# Patient Record
Sex: Female | Born: 1950 | Race: White | Hispanic: No | Smoking: Former smoker
Health system: Southern US, Community
[De-identification: ages and names within clinical notes are randomized; demographics above are authoritative.]

## PROBLEM LIST (undated history)

## (undated) DIAGNOSIS — F329 Major depressive disorder, single episode, unspecified: Secondary | ICD-10-CM

## (undated) DIAGNOSIS — G2581 Restless legs syndrome: Secondary | ICD-10-CM

## (undated) DIAGNOSIS — F32A Depression, unspecified: Secondary | ICD-10-CM

## (undated) HISTORY — PX: TUBAL LIGATION: SHX77

---

## 2019-02-15 ENCOUNTER — Inpatient Hospital Stay (HOSPITAL_COMMUNITY)
Admission: AD | Admit: 2019-02-15 | Discharge: 2019-03-01 | DRG: 207 | Disposition: A | Discharge status: Home Health | Payer: Medicare Other | Source: Other Acute Inpatient Hospital | Attending: Internal Medicine | Admitting: Internal Medicine

## 2019-02-15 ENCOUNTER — Inpatient Hospital Stay (HOSPITAL_COMMUNITY): Payer: Medicare Other

## 2019-02-15 DIAGNOSIS — J9690 Respiratory failure, unspecified, unspecified whether with hypoxia or hypercapnia: Secondary | ICD-10-CM

## 2019-02-15 DIAGNOSIS — J384 Edema of larynx: Secondary | ICD-10-CM | POA: Diagnosis present

## 2019-02-15 DIAGNOSIS — Z9911 Dependence on respirator [ventilator] status: Secondary | ICD-10-CM | POA: Diagnosis not present

## 2019-02-15 DIAGNOSIS — Z6841 Body Mass Index (BMI) 40.0 and over, adult: Secondary | ICD-10-CM

## 2019-02-15 DIAGNOSIS — J1289 Other viral pneumonia: Secondary | ICD-10-CM | POA: Diagnosis present

## 2019-02-15 DIAGNOSIS — L8990 Pressure ulcer of unspecified site, unspecified stage: Secondary | ICD-10-CM

## 2019-02-15 DIAGNOSIS — Z79899 Other long term (current) drug therapy: Secondary | ICD-10-CM

## 2019-02-15 DIAGNOSIS — F329 Major depressive disorder, single episode, unspecified: Secondary | ICD-10-CM | POA: Diagnosis present

## 2019-02-15 DIAGNOSIS — R6889 Other general symptoms and signs: Secondary | ICD-10-CM | POA: Diagnosis not present

## 2019-02-15 DIAGNOSIS — Z20822 Contact with and (suspected) exposure to covid-19: Secondary | ICD-10-CM

## 2019-02-15 DIAGNOSIS — F17200 Nicotine dependence, unspecified, uncomplicated: Secondary | ICD-10-CM | POA: Diagnosis present

## 2019-02-15 DIAGNOSIS — Z66 Do not resuscitate: Secondary | ICD-10-CM | POA: Diagnosis present

## 2019-02-15 DIAGNOSIS — J9601 Acute respiratory failure with hypoxia: Secondary | ICD-10-CM | POA: Diagnosis present

## 2019-02-15 DIAGNOSIS — F419 Anxiety disorder, unspecified: Secondary | ICD-10-CM | POA: Diagnosis present

## 2019-02-15 DIAGNOSIS — Z01818 Encounter for other preprocedural examination: Secondary | ICD-10-CM

## 2019-02-15 DIAGNOSIS — L89812 Pressure ulcer of head, stage 2: Secondary | ICD-10-CM | POA: Diagnosis not present

## 2019-02-15 DIAGNOSIS — J80 Acute respiratory distress syndrome: Secondary | ICD-10-CM | POA: Diagnosis not present

## 2019-02-15 DIAGNOSIS — E662 Morbid (severe) obesity with alveolar hypoventilation: Secondary | ICD-10-CM | POA: Diagnosis present

## 2019-02-15 DIAGNOSIS — R001 Bradycardia, unspecified: Secondary | ICD-10-CM | POA: Diagnosis present

## 2019-02-15 DIAGNOSIS — Z978 Presence of other specified devices: Secondary | ICD-10-CM | POA: Diagnosis not present

## 2019-02-15 DIAGNOSIS — E785 Hyperlipidemia, unspecified: Secondary | ICD-10-CM | POA: Diagnosis present

## 2019-02-15 DIAGNOSIS — G9340 Encephalopathy, unspecified: Secondary | ICD-10-CM | POA: Diagnosis present

## 2019-02-15 DIAGNOSIS — R7989 Other specified abnormal findings of blood chemistry: Secondary | ICD-10-CM | POA: Diagnosis not present

## 2019-02-15 DIAGNOSIS — R579 Shock, unspecified: Secondary | ICD-10-CM | POA: Diagnosis not present

## 2019-02-15 DIAGNOSIS — R9431 Abnormal electrocardiogram [ECG] [EKG]: Secondary | ICD-10-CM | POA: Diagnosis present

## 2019-02-15 DIAGNOSIS — I959 Hypotension, unspecified: Secondary | ICD-10-CM | POA: Diagnosis not present

## 2019-02-15 DIAGNOSIS — R509 Fever, unspecified: Secondary | ICD-10-CM | POA: Diagnosis not present

## 2019-02-15 DIAGNOSIS — E876 Hypokalemia: Secondary | ICD-10-CM | POA: Diagnosis not present

## 2019-02-15 DIAGNOSIS — R0602 Shortness of breath: Secondary | ICD-10-CM

## 2019-02-15 DIAGNOSIS — Z452 Encounter for adjustment and management of vascular access device: Secondary | ICD-10-CM

## 2019-02-15 DIAGNOSIS — Z9289 Personal history of other medical treatment: Secondary | ICD-10-CM

## 2019-02-15 DIAGNOSIS — R0600 Dyspnea, unspecified: Secondary | ICD-10-CM

## 2019-02-15 HISTORY — DX: Depression, unspecified: F32.A

## 2019-02-15 HISTORY — DX: Major depressive disorder, single episode, unspecified: F32.9

## 2019-02-15 HISTORY — DX: Restless legs syndrome: G25.81

## 2019-02-15 LAB — POCT I-STAT 7, (LYTES, BLD GAS, ICA,H+H)
Acid-Base Excess: 7 mmol/L — ABNORMAL HIGH (ref 0.0–2.0)
Bicarbonate: 33.1 mmol/L — ABNORMAL HIGH (ref 20.0–28.0)
Calcium, Ion: 0.98 mmol/L — ABNORMAL LOW (ref 1.15–1.40)
HCT: 41 % (ref 36.0–46.0)
Hemoglobin: 13.9 g/dL (ref 12.0–15.0)
O2 Saturation: 94 %
Patient temperature: 98.6
Potassium: 2.9 mmol/L — ABNORMAL LOW (ref 3.5–5.1)
Sodium: 141 mmol/L (ref 135–145)
TCO2: 35 mmol/L — ABNORMAL HIGH (ref 22–32)
pCO2 arterial: 52.3 mmHg — ABNORMAL HIGH (ref 32.0–48.0)
pH, Arterial: 7.409 (ref 7.350–7.450)
pO2, Arterial: 73 mmHg — ABNORMAL LOW (ref 83.0–108.0)

## 2019-02-15 LAB — MRSA PCR SCREENING: MRSA by PCR: NEGATIVE

## 2019-02-15 MED ORDER — ZINC SULFATE 220 (50 ZN) MG PO CAPS
220.0000 mg | ORAL_CAPSULE | Freq: Two times a day (BID) | ORAL | Status: DC
  Administered 2019-02-16 (×2): 220 mg
  Filled 2019-02-15 (×2): qty 1

## 2019-02-15 MED ORDER — HEPARIN (PORCINE) 25000 UT/250ML-% IV SOLN
1600.0000 [IU]/h | INTRAVENOUS | Status: DC
  Administered 2019-02-15: 1450 [IU]/h via INTRAVENOUS
  Administered 2019-02-16: 1600 [IU]/h via INTRAVENOUS
  Filled 2019-02-15 (×4): qty 250

## 2019-02-15 MED ORDER — MIDAZOLAM HCL 2 MG/2ML IJ SOLN
2.0000 mg | Freq: Once | INTRAMUSCULAR | Status: AC
  Administered 2019-02-15: 21:00:00 2 mg via INTRAVENOUS

## 2019-02-15 MED ORDER — HEPARIN BOLUS VIA INFUSION
5000.0000 [IU] | Freq: Once | INTRAVENOUS | Status: AC
  Administered 2019-02-15: 5000 [IU] via INTRAVENOUS
  Filled 2019-02-15: qty 5000

## 2019-02-15 MED ORDER — ETOMIDATE 2 MG/ML IV SOLN
20.0000 mg | Freq: Once | INTRAVENOUS | Status: AC
  Administered 2019-02-15: 21:00:00 20 mg via INTRAVENOUS

## 2019-02-15 MED ORDER — FENTANYL BOLUS VIA INFUSION
25.0000 ug | INTRAVENOUS | Status: DC | PRN
  Administered 2019-02-15: 21:00:00 50 ug via INTRAVENOUS
  Administered 2019-02-16 (×2): 25 ug via INTRAVENOUS
  Filled 2019-02-15: qty 25

## 2019-02-15 MED ORDER — FENTANYL CITRATE (PF) 100 MCG/2ML IJ SOLN: 50.0000 ug | Freq: Once | INTRAMUSCULAR | Status: AC

## 2019-02-15 MED ORDER — FENTANYL 2500MCG IN NS 250ML (10MCG/ML) PREMIX INFUSION
25.0000 ug/h | INTRAVENOUS | Status: AC
  Administered 2019-02-15 – 2019-02-16 (×2): 200 ug/h via INTRAVENOUS
  Administered 2019-02-16 – 2019-02-17 (×5): 400 ug/h via INTRAVENOUS
  Filled 2019-02-15 (×6): qty 250

## 2019-02-15 MED ORDER — MIDAZOLAM HCL 2 MG/2ML IJ SOLN
1.0000 mg | INTRAMUSCULAR | Status: DC | PRN
  Administered 2019-02-15 – 2019-02-17 (×3): 1 mg via INTRAVENOUS

## 2019-02-15 MED ORDER — SODIUM CHLORIDE 0.9 % IV SOLN
2.0000 g | Freq: Every day | INTRAVENOUS | Status: DC
  Administered 2019-02-15 – 2019-02-19 (×5): 2 g via INTRAVENOUS
  Filled 2019-02-15 (×6): qty 20

## 2019-02-15 MED ORDER — ROCURONIUM BROMIDE 50 MG/5ML IV SOLN
100.0000 mg | Freq: Once | INTRAVENOUS | Status: AC
  Administered 2019-02-15: 21:00:00 100 mg via INTRAVENOUS
  Filled 2019-02-15: qty 10

## 2019-02-15 MED ORDER — SODIUM CHLORIDE 0.9 % IV SOLN
500.0000 mg | Freq: Every day | INTRAVENOUS | Status: DC
  Administered 2019-02-15 – 2019-02-19 (×5): 500 mg via INTRAVENOUS
  Filled 2019-02-15 (×6): qty 500

## 2019-02-15 MED ORDER — TOCILIZUMAB 400 MG/20ML IV SOLN
400.0000 mg | Freq: Once | INTRAVENOUS | Status: DC
  Filled 2019-02-15: qty 20

## 2019-02-15 MED ORDER — HYDROXYCHLOROQUINE SULFATE 200 MG PO TABS
200.0000 mg | ORAL_TABLET | Freq: Two times a day (BID) | ORAL | Status: AC
  Administered 2019-02-16 – 2019-02-20 (×8): 200 mg
  Filled 2019-02-15 (×8): qty 1

## 2019-02-15 MED ORDER — MORPHINE SULFATE 15 MG PO TABS
30.0000 mg | ORAL_TABLET | ORAL | Status: DC | PRN
  Administered 2019-02-18 – 2019-02-20 (×2): 30 mg
  Filled 2019-02-15 (×2): qty 2

## 2019-02-15 MED ORDER — MIDAZOLAM HCL 2 MG/2ML IJ SOLN
INTRAMUSCULAR | Status: AC
  Administered 2019-02-15: 21:00:00 2 mg via INTRAVENOUS
  Filled 2019-02-15: qty 4

## 2019-02-15 MED ORDER — MIDAZOLAM HCL 2 MG/2ML IJ SOLN
1.0000 mg | INTRAMUSCULAR | Status: DC | PRN
  Administered 2019-02-17 (×2): 1 mg via INTRAVENOUS
  Filled 2019-02-15: qty 2

## 2019-02-15 MED ORDER — SODIUM CHLORIDE 0.9 % IV SOLN
INTRAVENOUS | Status: DC
  Administered 2019-02-15 – 2019-02-16 (×2): via INTRAVENOUS

## 2019-02-15 MED ORDER — PANTOPRAZOLE SODIUM 40 MG IV SOLR
40.0000 mg | Freq: Every day | INTRAVENOUS | Status: DC
  Administered 2019-02-15 – 2019-02-16 (×2): 40 mg via INTRAVENOUS
  Filled 2019-02-15 (×2): qty 40

## 2019-02-15 MED ORDER — CEFTRIAXONE SODIUM 1 G IJ SOLR: 1.0000 g | INTRAMUSCULAR | Status: DC

## 2019-02-15 MED ORDER — HYDROXYCHLOROQUINE SULFATE 200 MG PO TABS
400.0000 mg | ORAL_TABLET | Freq: Two times a day (BID) | ORAL | Status: AC
  Administered 2019-02-16: 10:00:00 400 mg
  Filled 2019-02-15 (×2): qty 2

## 2019-02-15 NOTE — Procedures (Signed)
Intubation Procedure Note Shine Scrogham [id number] 06/08/1951  Procedure: Intubation Indications: Respiratory insufficiency  Procedure Details Consent: Risks of procedure as well as the alternatives and risks of each were explained to the (patient/caregiver).  Consent for procedure obtained. Time Out: Verified patient identification, verified procedure, site/side was marked, verified correct patient position, special equipment/implants available, medications/allergies/relevent history reviewed, required imaging and test results available.  Performed  Maximum sterile technique was used including antiseptics, cap, gloves, gown, hand hygiene and mask.  MAC and 3   RSI: Etomidate 20 and Fentanyl gtt started at 50 Pt was somnolent but did not achieve complete sedation Additional versed 2 was given Post introduction of ETT past VC, 100 Rocuronium given RN given prn orders for Versed and instructed to titrate Fentanyl up to 150 to achieve synchrony with vent  Evaluation Hemodynamic Status: BP stable throughout; O2 sats: stable throughout Patient's Current Condition: stable Complications: No apparent complications Patient did tolerate procedure well. Chest X-ray ordered to verify placement.  CXR: pending.   Rise Paganini Scatliffe 02/15/2019

## 2019-02-15 NOTE — H&P (Addendum)
NAME:  Sherry KeasCatherine Dovidio, MRN:  [id number], DOB:  11/09/1950, LOS: 0 ADMISSION DATE:  02/15/2019, CONSULTATION DATE:  02/15/19 REFERRING MD:  [facility]  CHIEF COMPLAINT:  SOB   Brief History   Sherry Becker is a 68 y.o. female who was admitted 4/8 after transfer from [place] [facility] with acute hypoxic respiratory failure requiring intubation.  She is currently admitted as suspected COVID and is being ruled out.  History of present illness   Pt is encephelopathic; therefore, this HPI is obtained from chart review. Sherry KeasCatherine Basich is a 68 y.o. female who has a PMH as outlined below (see "past medical history").  She [facility] 4[facility]8 with SOB.  Stated that symptoms had been present for 1 month and had minimal improvement.  She had seen PCP and "had a blood test done" (no results known or available).  No known travel history.  No known additional symptoms including fever, cough.  [facility], she had SpO2 of 34% on room air.  She was placed on 15L NRB and was [facility] and management and as a COVID-19 rule out.  [facility], she was intubated for persistent hypoxic respiratory failure requiring 15L NRB with increased WOB.  Labs [facility] noteable for D-dimer of 2158, CRP 185, lactic acid 3.9, PCT 0.18, WBC 7.4  EKG with NSR and QTc 404.  CXR demonstrated extensive bilateral pulmonary infiltrates.  Past Medical History  No definitive history but per med list from [facility], suspect combo of depression and mood disorder along with HLD.  Probable OSA.  Significant Hospital Events   4/8 > admit, intubated.  Consults:  None.  Procedures:  ETT 4/8 >   Significant Diagnostic Tests:  CXR 4/8 > extensive bilateral pulmonary infiltrates. LE duplex 4/8 >   Micro Data:  Blood 4/8 >  Sputum 4/8 >  RVP 4/8 >  COVID-19 4/18 >   Antimicrobials:  Azithromycin 4/8 >  Ceftriaxone 4/8 >    Interim history/subjective:  Just  intubated.  Objective:  There were no vitals taken for this visit.       No intake or output data in the 24 hours ending 02/15/19 2054 There were no vitals filed for this visit.  Examination: General: Adult female, just intubated. Neuro: Sedated, not responsive. HEENT: [place]/AT. Tongue petechia noted. Sclerae anicteric.  ETT in place. Cardiovascular: RRR, no M/R/G.  Lungs: Respirations even and unlabored.  Coarse bilaterally. Abdomen: BS x 4, soft, NT/ND.  Musculoskeletal: No gross deformities, no edema.  Skin: Intact, warm, no rashes.  Assessment & Plan:   Acute hypoxic respiratory failure - unclear etiology, possibly PNA vs COVID-19 vs PE (though bilateral infiltrates on CXR argues against this). - Just intubated, start on PRVC at 7cc/kg and adjust accordingly. - Assess ABG in 1 hour. - Bronchial hygiene. - Empiric ceftriaxone and azithromycin. - Empiric hydroxychloroquine, zinc. - Consider tocilizumab (IL-6 sent). - Follow cultures, RVP, nCoV. - Follow CXR.  Concern for PE - 1 month symptoms of dyspnea along with extremely elevated d-dimer (2158).  Could also be attributed to CoV alone; though D-dimer this high warrants empiric treatment.  Unable to CTA chest due to CoV rule out. - Empiric heparin gtt. - Assess LE duplex.  Best Practice:  Diet: TF's. Pain/Anxiety/Delirium protocol (if indicated): Fentanyl gtt / Midazolam PRN / Morphine scheduled.  RASS goal -1. VAP protocol (if indicated): In place. DVT prophylaxis: Heparin gtt. GI prophylaxis: PPI. Glucose control: N/A. Mobility:  Bedrest. Code Status: Full. Family Communication: Will call daughter and update over phone. Disposition: ICU.  Labs   CBC: No results for input(s): WBC, NEUTROABS, HGB, HCT, MCV, PLT in the last 168 hours. Basic Metabolic Panel: No results for input(s): NA, K, CL, CO2, GLUCOSE, BUN, CREATININE, CALCIUM, MG, PHOS in the last 168 hours. GFR: CrCl cannot be calculated (No successful lab  value found.). No results for input(s): PROCALCITON, WBC, LATICACIDVEN in the last 168 hours. Liver Function Tests: No results for input(s): AST, ALT, ALKPHOS, BILITOT, PROT, ALBUMIN in the last 168 hours. No results for input(s): LIPASE, AMYLASE in the last 168 hours. No results for input(s): AMMONIA in the last 168 hours. ABG No results found for: PHART, PCO2ART, PO2ART, HCO3, TCO2, ACIDBASEDEF, O2SAT  Coagulation Profile: No results for input(s): INR, PROTIME in the last 168 hours. Cardiac Enzymes: No results for input(s): CKTOTAL, CKMB, CKMBINDEX, TROPONINI in the last 168 hours. HbA1C: No results found for: HGBA1C CBG: No results for input(s): GLUCAP in the last 168 hours.  Review of Systems:   Unable to obtain as pt is encephalopathic.  Past medical history  She,  has no past medical history on file.   Surgical History   Not available.  Social History      Family history   Her family history is not on file.   Allergies No Known Allergies   Home meds  Prior to Admission medications   Not on File    Critical care time: 45 min.    Rutherford Guys, PA [facility] Pager: [phone].  If no answer, [phone] 02/15/2019, 8:54 PM

## 2019-02-15 NOTE — Progress Notes (Signed)
eLink Physician-Brief Progress Note Patient Name: Sherry Becker DOB: [birth year] MRN: [id number]   Date of Service  02/15/2019  HPI/Events of Note  68 y/o F presented at [place] with worsening shortness of breath. Transferred on 100% NRB mask but had to be intubated on arrival. AC 24/480/100% 16 PEEP. Sedated on Fentanyl and synchronized on vent. BP 128/81  HR 60 not requiring pressors  eICU Interventions   Started on HQ, azithromycin. Possible tocilizumab if without contraindication  Lung protective vent strategy        Sherry Becker 02/15/2019, 10:29 PM

## 2019-02-15 NOTE — Progress Notes (Signed)
ANTICOAGULATION CONSULT NOTE - Initial Consult  Pharmacy Consult for heparin Indication: R/o PE  No Known Allergies  Patient Measurements: Height: 5\' 4"  (162.6 cm) Weight: 290 lb (131.5 kg) IBW/kg (Calculated) : 54.7 Heparin Dosing Weight: 87 kg  Vital Signs: BP: 149/87 (04/08 2115) Pulse Rate: 75 (04/08 2115)  Labs: No results for input(s): HGB, HCT, PLT, APTT, LABPROT, INR, HEPARINUNFRC, HEPRLOWMOCWT, CREATININE, CKTOTAL, CKMB, TROPONINI in the last 72 hours.  CrCl cannot be calculated (No successful lab value found.).   Medical History: No past medical history on file.  Medications:  No medications prior to admission.    Assessment: 54 YOF with acute respiratory failure and elevated D-dimer to start IV heparin due to concern for acute PE. Patient transferred from OSH but no anticoagulants administered prior to transfer.   Goal of Therapy:  Heparin level 0.3-0.7 units/ml Monitor platelets by anticoagulation protocol: Yes   Plan:  -Heparin 5000 units IV bolus, then start IV heparin at 1450 units/hr -F/u 6 hr HL -Monitor daily HL, CBC and s/s of bleeding   Vinnie Level, PharmD., BCPS Clinical Pharmacist Clinical phone for 02/15/19 until 10:30pm: [phone] If after 10:30pm, please refer to [facility] for unit-specific pharmacist

## 2019-02-16 ENCOUNTER — Inpatient Hospital Stay (HOSPITAL_COMMUNITY): Payer: Medicare Other

## 2019-02-16 LAB — CBC WITH DIFFERENTIAL/PLATELET
Abs Immature Granulocytes: 0.06 10*3/uL (ref 0.00–0.07)
Basophils Absolute: 0 10*3/uL (ref 0.0–0.1)
Basophils Relative: 0 %
Eosinophils Absolute: 0 10*3/uL (ref 0.0–0.5)
Eosinophils Relative: 0 %
HCT: 42.7 % (ref 36.0–46.0)
Hemoglobin: 13 g/dL (ref 12.0–15.0)
Immature Granulocytes: 1 %
Lymphocytes Relative: 23 %
Lymphs Abs: 1.5 10*3/uL (ref 0.7–4.0)
MCH: 29.5 pg (ref 26.0–34.0)
MCHC: 30.4 g/dL (ref 30.0–36.0)
MCV: 96.8 fL (ref 80.0–100.0)
Monocytes Absolute: 0.4 10*3/uL (ref 0.1–1.0)
Monocytes Relative: 6 %
Neutro Abs: 4.5 10*3/uL (ref 1.7–7.7)
Neutrophils Relative %: 70 %
Platelets: 161 10*3/uL (ref 150–400)
RBC: 4.41 MIL/uL (ref 3.87–5.11)
RDW: 14.6 % (ref 11.5–15.5)
WBC: 6.4 10*3/uL (ref 4.0–10.5)
nRBC: 0.6 % — ABNORMAL HIGH (ref 0.0–0.2)

## 2019-02-16 LAB — POCT I-STAT 7, (LYTES, BLD GAS, ICA,H+H)
Acid-Base Excess: 8 mmol/L — ABNORMAL HIGH (ref 0.0–2.0)
Acid-Base Excess: 6 mmol/L — ABNORMAL HIGH (ref 0.0–2.0)
Acid-Base Excess: 10 mmol/L — ABNORMAL HIGH (ref 0.0–2.0)
Bicarbonate: 33.1 mmol/L — ABNORMAL HIGH (ref 20.0–28.0)
Bicarbonate: 32.2 mmol/L — ABNORMAL HIGH (ref 20.0–28.0)
Bicarbonate: 34.7 mmol/L — ABNORMAL HIGH (ref 20.0–28.0)
Calcium, Ion: 0.97 mmol/L — ABNORMAL LOW (ref 1.15–1.40)
Calcium, Ion: 1.01 mmol/L — ABNORMAL LOW (ref 1.15–1.40)
Calcium, Ion: 0.93 mmol/L — ABNORMAL LOW (ref 1.15–1.40)
HCT: 39 % (ref 36.0–46.0)
HCT: 37 % (ref 36.0–46.0)
HCT: 48 % — ABNORMAL HIGH (ref 36.0–46.0)
Hemoglobin: 13.3 g/dL (ref 12.0–15.0)
Hemoglobin: 12.6 g/dL (ref 12.0–15.0)
Hemoglobin: 16.3 g/dL — ABNORMAL HIGH (ref 12.0–15.0)
O2 Saturation: 99 %
O2 Saturation: 91 %
O2 Saturation: 100 %
Patient temperature: 98.6
Patient temperature: 98
Patient temperature: 98.6
Potassium: 3 mmol/L — ABNORMAL LOW (ref 3.5–5.1)
Potassium: 3 mmol/L — ABNORMAL LOW (ref 3.5–5.1)
Potassium: 2.9 mmol/L — ABNORMAL LOW (ref 3.5–5.1)
Sodium: 140 mmol/L (ref 135–145)
Sodium: 142 mmol/L (ref 135–145)
Sodium: 141 mmol/L (ref 135–145)
TCO2: 34 mmol/L — ABNORMAL HIGH (ref 22–32)
TCO2: 34 mmol/L — ABNORMAL HIGH (ref 22–32)
TCO2: 36 mmol/L — ABNORMAL HIGH (ref 22–32)
pCO2 arterial: 46.7 mmHg (ref 32.0–48.0)
pCO2 arterial: 50.3 mmHg — ABNORMAL HIGH (ref 32.0–48.0)
pCO2 arterial: 45.6 mmHg (ref 32.0–48.0)
pH, Arterial: 7.459 — ABNORMAL HIGH (ref 7.350–7.450)
pH, Arterial: 7.412 (ref 7.350–7.450)
pH, Arterial: 7.489 — ABNORMAL HIGH (ref 7.350–7.450)
pO2, Arterial: 126 mmHg — ABNORMAL HIGH (ref 83.0–108.0)
pO2, Arterial: 61 mmHg — ABNORMAL LOW (ref 83.0–108.0)
pO2, Arterial: 252 mmHg — ABNORMAL HIGH (ref 83.0–108.0)

## 2019-02-16 LAB — RESPIRATORY PANEL BY PCR

## 2019-02-16 LAB — PROTIME-INR
INR: 1.1 (ref 0.8–1.2)
Prothrombin Time: 14 seconds (ref 11.4–15.2)

## 2019-02-16 LAB — COMPREHENSIVE METABOLIC PANEL
ALT: 23 U/L (ref 0–44)
AST: 25 U/L (ref 15–41)
Albumin: 2.9 g/dL — ABNORMAL LOW (ref 3.5–5.0)
Alkaline Phosphatase: 108 U/L (ref 38–126)
Anion gap: 11 (ref 5–15)
BUN: 9 mg/dL (ref 8–23)
CO2: 31 mmol/L (ref 22–32)
Calcium: 7.1 mg/dL — ABNORMAL LOW (ref 8.9–10.3)
Chloride: 99 mmol/L (ref 98–111)
Creatinine, Ser: 0.73 mg/dL (ref 0.44–1.00)
GFR calc Af Amer: >60 mL/min (ref >60)
GFR calc non Af Amer: >60 mL/min (ref >60)
Glucose, Bld: 120 mg/dL — ABNORMAL HIGH (ref 70–99)
Potassium: 3 mmol/L — ABNORMAL LOW (ref 3.5–5.1)
Sodium: 141 mmol/L (ref 135–145)
Total Bilirubin: 0.8 mg/dL (ref 0.3–1.2)
Total Protein: 5.5 g/dL — ABNORMAL LOW (ref 6.5–8.1)

## 2019-02-16 LAB — URINE CULTURE: Culture: NO GROWTH

## 2019-02-16 LAB — RAPID URINE DRUG SCREEN, HOSP PERFORMED
Amphetamines: NOT DETECTED
Barbiturates: NOT DETECTED
Benzodiazepines: POSITIVE — AB
Cocaine: NOT DETECTED
Opiates: NOT DETECTED
Tetrahydrocannabinol: POSITIVE — AB

## 2019-02-16 LAB — GLUCOSE, CAPILLARY
Glucose-Capillary: 118 mg/dL — ABNORMAL HIGH (ref 70–99)
Glucose-Capillary: 123 mg/dL — ABNORMAL HIGH (ref 70–99)
Glucose-Capillary: 123 mg/dL — ABNORMAL HIGH (ref 70–99)

## 2019-02-16 LAB — POCT ACTIVATED CLOTTING TIME: Activated Clotting Time: 0 seconds

## 2019-02-16 LAB — HEPARIN LEVEL (UNFRACTIONATED)
Heparin Unfractionated: 0.27 IU/mL — ABNORMAL LOW (ref 0.30–0.70)
Heparin Unfractionated: 0.33 IU/mL (ref 0.30–0.70)

## 2019-02-16 LAB — MAGNESIUM: Magnesium: 1.7 mg/dL (ref 1.7–2.4)

## 2019-02-16 LAB — TRIGLYCERIDES
Triglycerides: 146 mg/dL (ref <150)
Triglycerides: 195 mg/dL — ABNORMAL HIGH (ref <150)

## 2019-02-16 LAB — PHOSPHORUS: Phosphorus: 1.8 mg/dL — ABNORMAL LOW (ref 2.5–4.6)

## 2019-02-16 LAB — HIV ANTIBODY (ROUTINE TESTING W REFLEX): HIV Screen 4th Generation wRfx: NONREACTIVE

## 2019-02-16 MED ORDER — SODIUM CHLORIDE 0.9 % IV SOLN
INTRAVENOUS | Status: DC | PRN
  Administered 2019-02-17 – 2019-02-21 (×3): via INTRA_ARTERIAL

## 2019-02-16 MED ORDER — VECURONIUM BROMIDE 10 MG IV SOLR
10.0000 mg | Freq: Once | INTRAVENOUS | Status: AC
  Administered 2019-02-16: 18:00:00 10 mg via INTRAVENOUS

## 2019-02-16 MED ORDER — FUROSEMIDE 10 MG/ML IJ SOLN
40.0000 mg | Freq: Once | INTRAMUSCULAR | Status: AC
  Administered 2019-02-16: 40 mg via INTRAVENOUS
  Filled 2019-02-16: qty 4

## 2019-02-16 MED ORDER — POTASSIUM CHLORIDE 20 MEQ/15ML (10%) PO SOLN
20.0000 meq | Freq: Two times a day (BID) | ORAL | Status: DC
  Administered 2019-02-16 – 2019-02-18 (×5): 20 meq
  Filled 2019-02-16 (×5): qty 15

## 2019-02-16 MED ORDER — VECURONIUM BROMIDE 10 MG IV SOLR
INTRAVENOUS | Status: AC
  Filled 2019-02-16: qty 10

## 2019-02-16 MED ORDER — VITAL HIGH PROTEIN PO LIQD
1000.0000 mL | ORAL | Status: DC
  Administered 2019-02-16: 1000 mL

## 2019-02-16 MED ORDER — POTASSIUM PHOSPHATES 15 MMOLE/5ML IV SOLN
30.0000 mmol | Freq: Once | INTRAVENOUS | Status: AC
  Administered 2019-02-16: 14:00:00 30 mmol via INTRAVENOUS
  Filled 2019-02-16: qty 10

## 2019-02-16 MED ORDER — PROPOFOL 10 MG/ML IV BOLUS
INTRAVENOUS | Status: AC
  Administered 2019-02-16: 17:00:00
  Filled 2019-02-16: qty 20

## 2019-02-16 MED ORDER — HEPARIN SODIUM (PORCINE) 5000 UNIT/ML IJ SOLN
5000.0000 [IU] | Freq: Three times a day (TID) | INTRAMUSCULAR | Status: DC
  Administered 2019-02-16 – 2019-02-17 (×2): 5000 [IU] via SUBCUTANEOUS
  Filled 2019-02-16 (×2): qty 1

## 2019-02-16 MED ORDER — PHENYLEPHRINE HCL-NACL 10-0.9 MG/250ML-% IV SOLN
0.0000 ug/min | INTRAVENOUS | Status: DC
  Administered 2019-02-16 (×2): 40 ug/min via INTRAVENOUS
  Administered 2019-02-20: 19:00:00 20 ug/min via INTRAVENOUS
  Administered 2019-02-21: 06:00:00 10 ug/min via INTRAVENOUS
  Filled 2019-02-16 (×5): qty 250

## 2019-02-16 MED ORDER — ORAL CARE MOUTH RINSE
15.0000 mL | OROMUCOSAL | Status: DC
  Administered 2019-02-16 – 2019-02-25 (×90): 15 mL via OROMUCOSAL

## 2019-02-16 MED ORDER — ZINC SULFATE 220 (50 ZN) MG PO CAPS
220.0000 mg | ORAL_CAPSULE | Freq: Every day | ORAL | Status: DC
  Administered 2019-02-17 – 2019-02-21 (×5): 220 mg
  Filled 2019-02-16 (×5): qty 1

## 2019-02-16 MED ORDER — TOCILIZUMAB 400 MG/20ML IV SOLN
400.0000 mg | Freq: Once | INTRAVENOUS | Status: AC
  Administered 2019-02-16: 400 mg via INTRAVENOUS
  Filled 2019-02-16: qty 20

## 2019-02-16 MED ORDER — PROPOFOL 1000 MG/100ML IV EMUL
5.0000 ug/kg/min | INTRAVENOUS | Status: DC
  Administered 2019-02-16: 17:00:00 40 ug/kg/min via INTRAVENOUS
  Administered 2019-02-19: 10 ug/kg/min via INTRAVENOUS
  Administered 2019-02-19: 20 ug/kg/min via INTRAVENOUS
  Administered 2019-02-20: 45 ug/kg/min via INTRAVENOUS
  Administered 2019-02-20 (×2): 40 ug/kg/min via INTRAVENOUS
  Administered 2019-02-20: 10 ug/kg/min via INTRAVENOUS
  Administered 2019-02-20: 13:00:00 45 ug/kg/min via INTRAVENOUS
  Administered 2019-02-21 (×4): 35 ug/kg/min via INTRAVENOUS
  Administered 2019-02-21 – 2019-02-22 (×2): 10 ug/kg/min via INTRAVENOUS
  Filled 2019-02-16 (×14): qty 100

## 2019-02-16 MED ORDER — MIDAZOLAM 50MG/50ML (1MG/ML) PREMIX INFUSION
4.0000 mg/h | INTRAVENOUS | Status: DC
  Administered 2019-02-16: 4 mg/h via INTRAVENOUS
  Administered 2019-02-16: 2 mg/h via INTRAVENOUS
  Administered 2019-02-17 – 2019-02-18 (×3): 4 mg/h via INTRAVENOUS
  Filled 2019-02-16 (×6): qty 50

## 2019-02-16 MED ORDER — CHLORHEXIDINE GLUCONATE 0.12% ORAL RINSE (MEDLINE KIT)
15.0000 mL | Freq: Two times a day (BID) | OROMUCOSAL | Status: DC
  Administered 2019-02-16 – 2019-02-25 (×20): 15 mL via OROMUCOSAL

## 2019-02-16 NOTE — Progress Notes (Signed)
ANTICOAGULATION CONSULT NOTE  Pharmacy Consult for Heparin Indication: rule out PE  No Known Allergies  Patient Measurements: Height: 5\' 4"  (162.6 cm) Weight: 291 lb (132 kg) IBW/kg (Calculated) : 54.7 Heparin Dosing Weight: 87 kg  Vital Signs: Temp: 99.9 F (37.7 C) (04/09 0400) Temp Source: Oral (04/09 0400) BP: 125/64 (04/09 0700) Pulse Rate: 56 (04/09 0700)  Labs: Recent Labs    02/15/19 2252 02/16/19 0101 02/16/19 0636  HGB 13.9 13.0  --   HCT 41.0 42.7  --   PLT  --  161  --   LABPROT  --  14.0  --   INR  --  1.1  --   HEPARINUNFRC  --   --  0.27*  CREATININE  --  0.73  --     Estimated Creatinine Clearance: 91 mL/min (by C-G formula based on SCr of 0.73 mg/dL).   Medical History: No past medical history on file.  Medications:  No medications prior to admission.    Assessment: 54 YOF with acute respiratory failure and elevated D-dimer to start IV heparin due to concern for acute PE. Patient transferred from OSH but no anticoagulants administered prior to transfer.   4/9 AM update: initial heparin level just below goal, CBC good.   Goal of Therapy:  Heparin level 0.3-0.7 units/ml Monitor platelets by anticoagulation protocol: Yes   Plan:  -Inc heparin to 1600 units/hr -1400 HL -Monitor Daily HL, CBC and s/s of bleeding   Abran Duke, PharmD, BCPS Clinical Pharmacist Phone: [phone]

## 2019-02-16 NOTE — Progress Notes (Signed)
Confirmed by Vernell Leep IP at [facility], + COVID 19 test- collected on 02/15/19. Unit notified.  MSteelman RN BSN  Infection Prevention

## 2019-02-16 NOTE — Progress Notes (Signed)
eLink Physician-Brief Progress Note Patient Name: Sherry Becker DOB: 27-Apr-1951 MRN: [id number]   Date of Service  02/16/2019  HPI/Events of Note  Called for clarification in orders regarding heparin drip and fluids which are not ongoing despite being ordered  eICU Interventions   On chart review plan was to discontinue heparin and patient is being diureses  Ordered to discontinue fluids and heparin drip switching to prophylactic dose     Intervention Category Intermediate Interventions: Other:  Darl Pikes 02/16/2019, 9:22 PM

## 2019-02-16 NOTE — Progress Notes (Signed)
Central line and aline placed per Dr. Isaiah Serge. Pt then proned with respiratory and MD at bedside. Pt sedated and stable during this time.

## 2019-02-16 NOTE — Procedures (Signed)
Arterial Catheter Insertion Procedure Note Sherry Becker [id number] September 29, 1951  Procedure: Insertion of Arterial Catheter  Indications: Blood pressure monitoring  Procedure Details Consent: Unable to obtain consent because of sedated on ventilator. Emergent procedure Time Out: Verified patient identification, verified procedure, site/side was marked, verified correct patient position, special equipment/implants available, medications/allergies/relevent history reviewed, required imaging and test results available.  Performed  Maximum sterile technique was used including antiseptics, cap, gloves, gown, hand hygiene, mask and sheet. Skin prep: Chlorhexidine; local anesthetic administered 20 gauge catheter was inserted into right radial artery using the Seldinger technique. ULTRASOUND GUIDANCE USED: NO Evaluation Blood flow good; BP tracing good. Complications: No apparent complications.   Sherry Becker 02/16/2019

## 2019-02-16 NOTE — Procedures (Signed)
Central Venous Catheter Insertion Procedure Note Hanan Sajdak [id number] 20-Apr-1951  Procedure: Insertion of Central Venous Catheter Indications: Assessment of intravascular volume and Drug and/or fluid administration  Procedure Details Consent: Unable to obtain consent because of altered level of consciousness. Time Out: Verified patient identification, verified procedure, site/side was marked, verified correct patient position, special equipment/implants available, medications/allergies/relevent history reviewed, required imaging and test results available.  Performed  Maximum sterile technique was used including antiseptics, cap, gloves, gown, hand hygiene, mask and sheet. Skin prep: Chlorhexidine; local anesthetic administered A antimicrobial bonded/coated triple lumen catheter was placed in the left internal jugular vein using the Seldinger technique.  Evaluation Blood flow good Complications: No apparent complications Patient did tolerate procedure well. Chest X-ray ordered to verify placement.  CXR: normal.  Azharia Surratt 02/16/2019, 6:57 PM

## 2019-02-16 NOTE — Progress Notes (Addendum)
Initial Nutrition Assessment RD working remotely.  DOCUMENTATION CODES:   Morbid obesity  INTERVENTION:    Vital High Protein, begin at 25 ml/h, increase by 10 ml every 4 hours to goal rate of 65 ml/h (1560 ml per day)   Provides 1560 kcal, 137 gm protein, 1304 ml free water daily   Monitor magnesium, potassium, and phosphorus, MD to replete as needed, as pt is at risk for refeeding syndrome given suspected poor intake x 1 month with low K+ and phos.   NUTRITION DIAGNOSIS:   Inadequate oral intake related to inability to eat as evidenced by NPO status.  GOAL:   Provide needs based on ASPEN/SCCM guidelines  MONITOR:   Vent status, TF tolerance, Labs, Skin, I & O's  REASON FOR ASSESSMENT:   Ventilator, Consult Enteral/tube feeding initiation and management  ASSESSMENT:   68 yo female with PMH of HLD and depression who was transferred from [facility] on 4/8 with acute respiratory failure requiring intubation and COVID-19 rule out.  Received MD Consult for TF initiation and management.   Patient is currently intubated on ventilator support MV: 10 L/min Temp (24hrs), Avg:99.1 F (37.3 C), Min:98 F (36.7 C), Max:99.9 F (37.7 C)   Labs reviewed. Potassium 3 (L), phosphorus 1.8 (L) CBG: 123 Medications reviewed and include plaquenil, zinc, fentanyl.  Suspect intake PTA has been less than usual due to SOB for one month. Phos and K+ are low. Some concern for refeeding syndrome.  NUTRITION - FOCUSED PHYSICAL EXAM:  unable to complete  Diet Order:   Diet Order            Diet NPO time specified  Diet effective now              EDUCATION NEEDS:   No education needs have been identified at this time  Skin:  Skin Assessment: Reviewed RN Assessment  Last BM:  4/7  Height:   Ht Readings from Last 1 Encounters:  02/15/19 5\' 4"  (1.626 m)    Weight:   Wt Readings from Last 1 Encounters:  02/16/19 132 kg    Ideal Body Weight:  54.5  kg  BMI:  Body mass index is 49.95 kg/m.  Estimated Nutritional Needs:   Kcal:  1450-1850  Protein:  136 gm  Fluid:  2 L    Joaquin Courts, RD, LDN, CNSC Pager [phone] After Hours Pager [phone]

## 2019-02-16 NOTE — Progress Notes (Addendum)
NAME:  Sherry KeasCatherine Stark, MRN:  [id number], DOB:  03/06/1951, LOS: 1 ADMISSION DATE:  02/15/2019, CONSULTATION DATE:  02/15/19 [facility]:  [facility]  CHIEF COMPLAINT:  SOB   Brief History   Sherry Becker is a 68 y.o. female who was admitted 4/8 after transfer from [place] hospital with acute hypoxic respiratory failure requiring intubation.  She is currently admitted as suspected COVID and is being ruled out.  History of present illness   Pt is encephelopathic; therefore, this HPI is obtained from chart review. Sherry KeasCatherine Westervelt is a 68 y.o. female who has a PMH as outlined below (see "past medical history").  She [facility] to [facility] 4[facility] with SOB.  Stated that symptoms had been present for 1 month and had minimal improvement.  She had seen PCP and "had a blood test done" (no results known or available).  No known travel history.  No known additional symptoms including fever, cough.  [facility], she had SpO2 of 34% on room air.  She was placed on 15L NRB and [facility] and management and as a COVID-19 rule out.  [facility], she was intubated for persistent hypoxic respiratory failure requiring 15L NRB with increased WOB.  Labs from [facility] noteable for D-dimer of 2158, CRP 185, lactic acid 3.9, PCT 0.18, WBC 7.4  EKG with NSR and QTc 404.  CXR demonstrated extensive bilateral pulmonary infiltrates.  Past Medical History  No definitive history but per med list from [facility], suspect combo of depression and mood disorder along with HLD.  Probable OSA.  Significant Hospital Events   4/8 > admit, intubated.  Consults:  None.  Procedures:  ETT 4/8 >   Significant Diagnostic Tests:  CXR 4/8 > extensive bilateral pulmonary infiltrates. LE duplex 4/8 >   Micro Data:  Blood 4/8 >  Sputum 4/8 >  02/16/2019 quantiferon>> RVP 4/8 > negative COVID-19 4/8 >   Antimicrobials:  Azithromycin 4/8 >  Ceftriaxone 4/8 >    Plaquenil 4/6>>  Interim history/subjective:  Currently intubated.  Despite being on 90% FiO2 and 15 of PEEP she is awake alert no acute distress not overbreathing the ventilator which is set at 24.  Note h I  time was set at 1.10 and was decreased .9.  Noted to be air trapping  Objective:  Blood pressure 117/66, pulse (!) 55, temperature 98 F (36.7 C), temperature source Oral, resp. rate (!) 21, height 5\' 4"  (1.626 m), weight 132 kg, SpO2 94 %.    Vent Mode: PRVC FiO2 (%):  [90 %-100 %] 90 % Set Rate:  [24 bmp] 24 bmp Vt Set:  [430 mL-480 mL] 430 mL PEEP:  [15 cmH20] 15 cmH20 Plateau Pressure:  [21 cmH20-31 cmH20] 21 cmH20   Intake/Output Summary (Last 24 hours) at 02/16/2019 [id number] Last data filed at 02/16/2019 0800 Gross per 24 hour  Intake 1131.96 ml  Output 810 ml  Net 321.96 ml   Filed Weights   02/15/19 2000 02/15/19 2042 02/16/19 0456  Weight: 131.5 kg 131.5 kg 132 kg    Examination: General: Morbidly obese female who is awake and alert despite sedation and in no acute distress despite being on 90% FiO2 and 15 of PEEP HEENT: Endotracheal tube in place, orogastric tube in place Neuro: Awake and alert moves all extremities follows commands holding a fan to cool herself CV: Heart sounds are distant regular rate and rhythm sinus bradycardia at 56 PULM: Diminished breath  sounds throughout FiO2 of 90% with 15 of PEEP generating O2 saturations of 94% Note she is on aI time of 1.10 decreased to .9 and she was noted to be air trapping on previous settings. P/F ratio 81 ZM:CEYE, non-tender, bsx4 active  Extremities: warm/dry, 2+ edema  Skin: no rashes or lesions   Assessment & Plan:   Acute hypoxic respiratory failure - unclear etiology, possibly PNA vs COVID-19 vs PE (though bilateral infiltrates on CXR argues against this). Intubated 02/15/2019 currently is on 90% FiO2 with 15 of PEEP but not in acute distress. Monitor ABG Pulmonary toilet Continue ceftriaxone and Zithromax.   Plaquenil and Xanax Consider tocilizumab ild 6 sent Monitor cultures including COVID-19  Serial chest x-ray May need increased sedation and proning in fututre.  Concern for PE - 1 month symptoms of dyspnea along with extremely elevated d-dimer (2158).  Could also be attributed to CoV alone; though D-dimer this high warrants empiric treatment.  Unable to CTA chest due to CoV rule out. Currently on heparin drip Lower extremity Doppler studies ordered on 02/15/2019 have not been done as of yet  Best Practice:  Diet: TF's. Pain/Anxiety/Delirium protocol (if indicated): Fentanyl gtt / Midazolam PRN / Morphine scheduled.  RASS goal -1. VAP protocol (if indicated): In place. DVT prophylaxis: Heparin gtt. GI prophylaxis: PPI. Glucose control: N/A. Mobility: Bedrest. Code Status: Full. Family Communication: 02/16/2019 we will need update family via telephone today. Disposition: ICU.  Labs   CBC: Recent Labs  Lab 02/15/19 2252 02/16/19 0101  WBC  --  6.4  NEUTROABS  --  4.5  HGB 13.9 13.0  HCT 41.0 42.7  MCV  --  96.8  PLT  --  161   Basic Metabolic Panel: Recent Labs  Lab 02/15/19 2252 02/16/19 0101  NA 141 141  K 2.9* 3.0*  CL  --  99  CO2  --  31  GLUCOSE  --  120*  BUN  --  9  CREATININE  --  0.73  CALCIUM  --  7.1*  MG  --  1.7  PHOS  --  1.8*   GFR: Estimated Creatinine Clearance: 91 mL/min (by C-G formula based on SCr of 0.73 mg/dL). Recent Labs  Lab 02/16/19 0101  WBC 6.4   Liver Function Tests: Recent Labs  Lab 02/16/19 0101  AST 25  ALT 23  ALKPHOS 108  BILITOT 0.8  PROT 5.5*  ALBUMIN 2.9*   No results for input(s): LIPASE, AMYLASE in the last 168 hours. No results for input(s): AMMONIA in the last 168 hours. ABG    Component Value Date/Time   PHART 7.409 02/15/2019 2252   PCO2ART 52.3 (H) 02/15/2019 2252   PO2ART 73.0 (L) 02/15/2019 2252   HCO3 33.1 (H) 02/15/2019 2252   TCO2 35 (H) 02/15/2019 2252   O2SAT 94.0 02/15/2019 2252     Coagulation Profile: Recent Labs  Lab 02/16/19 0101  INR 1.1   Cardiac Enzymes: No results for input(s): CKTOTAL, CKMB, CKMBINDEX, TROPONINI in the last 168 hours. HbA1C: No results found for: HGBA1C CBG: Recent Labs  Lab 02/15/19 2148 02/16/19 0127  GLUCAP 118* 123*     Critical care time: 45 min.    Brett Canales Rocsi Hazelbaker ACNP [facility] Pager [phone] till 1 pm If no answer page [phone] 02/16/2019, 9:03 AM

## 2019-02-16 NOTE — Progress Notes (Signed)
ANTICOAGULATION CONSULT NOTE  Pharmacy Consult for Heparin Indication: rule out PE  No Known Allergies  Patient Measurements: Height: 5\' 4"  (162.6 cm) Weight: 291 lb (132 kg) IBW/kg (Calculated) : 54.7 Heparin Dosing Weight: 87 kg  Vital Signs: Temp: 99 F (37.2 C) (04/09 1200) Temp Source: Oral (04/09 1200) BP: 118/68 (04/09 1400) Pulse Rate: 55 (04/09 1400)  Labs: Recent Labs    02/16/19 0101 02/16/19 0258 02/16/19 0636 02/16/19 1230  HGB 13.0 13.3  --  12.6  HCT 42.7 39.0  --  37.0  PLT 161  --   --   --   LABPROT 14.0  --   --   --   INR 1.1  --   --   --   HEPARINUNFRC  --   --  0.27*  --   CREATININE 0.73  --   --   --     Estimated Creatinine Clearance: 91 mL/min (by C-G formula based on SCr of 0.73 mg/dL).   Medical History: No past medical history on file.  Medications:  Medications Prior to Admission  Medication Sig Dispense Refill Last Dose  . DULoxetine (CYMBALTA) 20 MG capsule Take 20 mg by mouth daily.   unknown  . loperamide (IMODIUM) 2 MG capsule Take 2 mg by mouth as needed for diarrhea or loose stools.   unknown  . pseudoephedrine (SUDAFED) 30 MG tablet Take 30 mg by mouth every 4 (four) hours as needed for congestion.   02/15/2019 at Unknown time  . QUEtiapine (SEROQUEL) 200 MG tablet Take 200 mg by mouth daily.   unknown  . rOPINIRole (REQUIP) 2 MG tablet Take 2 mg by mouth 2 (two) times daily as needed.    unknown  . simvastatin (ZOCOR) 20 MG tablet Take 20 mg by mouth daily.   unknown    Assessment: 42 YOF with acute respiratory failure and elevated D-dimer to start IV heparin due to concern for acute PE. Patient transferred from OSH but no anticoagulants administered prior to transfer.   Heparin level now therapeutic at 0.33. CBCs stable, Platelets stable, no s/sx of bleeding per nurse.   Goal of Therapy:  Heparin level 0.3-0.7 units/ml Monitor platelets by anticoagulation protocol: Yes   Plan:  -Continue heparin to 1600  units/hr -Draw confirmatory heparin level at 2100 -Monitor Daily heparin level, CBC and s/s of bleeding   Gwynneth Albright, [place] D PGY1 Pharmacy Resident  Phone [phone] 02/16/2019   2:34 PM

## 2019-02-16 NOTE — Progress Notes (Addendum)
PCCM progress note  Patient's COVID testing is positive We will stop heparin drip as suspicion for PE is low. COVID infection explains the d dimer of 2.1 microg/ml  Will need central line and A-line placement Start proning. Will not paralyze as she is synchronous with vent with deep sedation. Discussed with pharmacy.  Will give 1 dose of actemera and lasix 40 mg x 1  Daughter updated over phone. I reccommended that patient be made no CPR/ no defib in case of cardiac arrest. She wants to discuss with her brother about this.   The patient is critically ill with multiple organ system failure and requires high complexity decision making for assessment and support, frequent evaluation and titration of therapies, advanced monitoring, review of radiographic studies and interpretation of complex data.   Critical Care Time devoted to patient care services, exclusive of separately billable procedures, described in this note is 35 minutes.   Chilton Greathouse MD [facility] Pager [phone] If no answer call [phone] 02/16/2019, 4:40 PM

## 2019-02-17 ENCOUNTER — Inpatient Hospital Stay (HOSPITAL_COMMUNITY): Payer: Medicare Other

## 2019-02-17 ENCOUNTER — Other Ambulatory Visit (HOSPITAL_COMMUNITY): Payer: Managed Care, Other (non HMO)

## 2019-02-17 ENCOUNTER — Encounter (HOSPITAL_COMMUNITY): Payer: Managed Care, Other (non HMO)

## 2019-02-17 DIAGNOSIS — J80 Acute respiratory distress syndrome: Secondary | ICD-10-CM

## 2019-02-17 DIAGNOSIS — Z9911 Dependence on respirator [ventilator] status: Secondary | ICD-10-CM

## 2019-02-17 LAB — POCT I-STAT 7, (LYTES, BLD GAS, ICA,H+H)
Acid-Base Excess: 10 mmol/L — ABNORMAL HIGH (ref 0.0–2.0)
Bicarbonate: 35.8 mmol/L — ABNORMAL HIGH (ref 20.0–28.0)
Calcium, Ion: 0.91 mmol/L — ABNORMAL LOW (ref 1.15–1.40)
HCT: 35 % — ABNORMAL LOW (ref 36.0–46.0)
Hemoglobin: 11.9 g/dL — ABNORMAL LOW (ref 12.0–15.0)
O2 Saturation: 99 %
Patient temperature: 98.8
Potassium: 3.4 mmol/L — ABNORMAL LOW (ref 3.5–5.1)
Sodium: 143 mmol/L (ref 135–145)
TCO2: 37 mmol/L — ABNORMAL HIGH (ref 22–32)
pCO2 arterial: 50.7 mmHg — ABNORMAL HIGH (ref 32.0–48.0)
pH, Arterial: 7.457 — ABNORMAL HIGH (ref 7.350–7.450)
pO2, Arterial: 154 mmHg — ABNORMAL HIGH (ref 83.0–108.0)

## 2019-02-17 LAB — CBC WITH DIFFERENTIAL/PLATELET
Abs Immature Granulocytes: 0.02 10*3/uL (ref 0.00–0.07)
Basophils Absolute: 0 10*3/uL (ref 0.0–0.1)
Basophils Relative: 0 %
Eosinophils Absolute: 0 10*3/uL (ref 0.0–0.5)
Eosinophils Relative: 0 %
HCT: 38.9 % (ref 36.0–46.0)
Hemoglobin: 11.8 g/dL — ABNORMAL LOW (ref 12.0–15.0)
Immature Granulocytes: 1 %
Lymphocytes Relative: 41 %
Lymphs Abs: 1.2 10*3/uL (ref 0.7–4.0)
MCH: 29.1 pg (ref 26.0–34.0)
MCHC: 30.3 g/dL (ref 30.0–36.0)
MCV: 96 fL (ref 80.0–100.0)
Monocytes Absolute: 0.2 10*3/uL (ref 0.1–1.0)
Monocytes Relative: 6 %
Neutro Abs: 1.5 10*3/uL — ABNORMAL LOW (ref 1.7–7.7)
Neutrophils Relative %: 52 %
Platelets: 155 10*3/uL (ref 150–400)
RBC: 4.05 MIL/uL (ref 3.87–5.11)
RDW: 14.7 % (ref 11.5–15.5)
WBC: 2.9 10*3/uL — ABNORMAL LOW (ref 4.0–10.5)
nRBC: 0 % (ref 0.0–0.2)

## 2019-02-17 LAB — BASIC METABOLIC PANEL
Anion gap: 11 (ref 5–15)
Anion gap: 13 (ref 5–15)
BUN: 9 mg/dL (ref 8–23)
BUN: 9 mg/dL (ref 8–23)
CO2: 33 mmol/L — ABNORMAL HIGH (ref 22–32)
CO2: 32 mmol/L (ref 22–32)
Calcium: 6.9 mg/dL — ABNORMAL LOW (ref 8.9–10.3)
Calcium: 7.1 mg/dL — ABNORMAL LOW (ref 8.9–10.3)
Chloride: 99 mmol/L (ref 98–111)
Chloride: 99 mmol/L (ref 98–111)
Creatinine, Ser: 0.73 mg/dL (ref 0.44–1.00)
Creatinine, Ser: 0.79 mg/dL (ref 0.44–1.00)
GFR calc Af Amer: >60 mL/min (ref >60)
GFR calc Af Amer: >60 mL/min (ref >60)
GFR calc non Af Amer: >60 mL/min (ref >60)
GFR calc non Af Amer: >60 mL/min (ref >60)
Glucose, Bld: 98 mg/dL (ref 70–99)
Glucose, Bld: 99 mg/dL (ref 70–99)
Potassium: 2.8 mmol/L — ABNORMAL LOW (ref 3.5–5.1)
Potassium: 3 mmol/L — ABNORMAL LOW (ref 3.5–5.1)
Sodium: 143 mmol/L (ref 135–145)
Sodium: 144 mmol/L (ref 135–145)

## 2019-02-17 LAB — QUANTIFERON-TB GOLD PLUS (RQFGPL)
QuantiFERON Mitogen Value: 1.75 IU/mL
QuantiFERON Nil Value: 0.07 IU/mL
QuantiFERON TB1 Ag Value: 0.07 IU/mL
QuantiFERON TB2 Ag Value: 0.07 IU/mL

## 2019-02-17 LAB — MAGNESIUM: Magnesium: 1.7 mg/dL (ref 1.7–2.4)

## 2019-02-17 LAB — GLUCOSE, CAPILLARY
Glucose-Capillary: 102 mg/dL — ABNORMAL HIGH (ref 70–99)
Glucose-Capillary: 106 mg/dL — ABNORMAL HIGH (ref 70–99)
Glucose-Capillary: 107 mg/dL — ABNORMAL HIGH (ref 70–99)
Glucose-Capillary: 115 mg/dL — ABNORMAL HIGH (ref 70–99)
Glucose-Capillary: 117 mg/dL — ABNORMAL HIGH (ref 70–99)
Glucose-Capillary: 135 mg/dL — ABNORMAL HIGH (ref 70–99)
Glucose-Capillary: 90 mg/dL (ref 70–99)
Glucose-Capillary: 94 mg/dL (ref 70–99)
Glucose-Capillary: 96 mg/dL (ref 70–99)

## 2019-02-17 LAB — D-DIMER, QUANTITATIVE: D-Dimer, Quant: 5.47 ug/mL-FEU — ABNORMAL HIGH (ref 0.00–0.50)

## 2019-02-17 LAB — INTERLEUKIN-6, PLASMA: Interleukin-6, Plasma: 144.1 pg/mL — ABNORMAL HIGH (ref 0.0–12.2)

## 2019-02-17 LAB — QUANTIFERON-TB GOLD PLUS: QuantiFERON-TB Gold Plus: NEGATIVE

## 2019-02-17 LAB — PHOSPHORUS: Phosphorus: 2.7 mg/dL (ref 2.5–4.6)

## 2019-02-17 MED ORDER — CALCIUM GLUCONATE-NACL 1-0.675 GM/50ML-% IV SOLN
1.0000 g | Freq: Once | INTRAVENOUS | Status: AC
  Administered 2019-02-17: 1000 mg via INTRAVENOUS
  Filled 2019-02-17: qty 50

## 2019-02-17 MED ORDER — POTASSIUM CHLORIDE 20 MEQ/15ML (10%) PO SOLN
40.0000 meq | Freq: Once | ORAL | Status: AC
  Administered 2019-02-17: 40 meq via ORAL
  Filled 2019-02-17: qty 30

## 2019-02-17 MED ORDER — PANTOPRAZOLE SODIUM 40 MG PO PACK
40.0000 mg | PACK | ORAL | Status: DC
  Administered 2019-02-17 – 2019-02-24 (×8): 40 mg
  Filled 2019-02-17 (×7): qty 20

## 2019-02-17 MED ORDER — OXYCODONE HCL 5 MG PO TABS
10.0000 mg | ORAL_TABLET | ORAL | Status: DC | PRN
  Filled 2019-02-17: qty 2

## 2019-02-17 MED ORDER — VITAL HIGH PROTEIN PO LIQD
1000.0000 mL | ORAL | Status: DC
  Administered 2019-02-17 – 2019-02-21 (×6): 1000 mL

## 2019-02-17 MED ORDER — FENTANYL BOLUS VIA INFUSION
25.0000 ug | INTRAVENOUS | Status: DC | PRN
  Administered 2019-02-17 – 2019-02-20 (×6): 100 ug via INTRAVENOUS
  Filled 2019-02-17: qty 100

## 2019-02-17 MED ORDER — ENOXAPARIN SODIUM 40 MG/0.4ML ~~LOC~~ SOLN
40.0000 mg | SUBCUTANEOUS | Status: DC
  Administered 2019-02-17 – 2019-02-21 (×5): 40 mg via SUBCUTANEOUS
  Filled 2019-02-17 (×5): qty 0.4

## 2019-02-17 MED ORDER — FUROSEMIDE 10 MG/ML IJ SOLN
20.0000 mg | Freq: Once | INTRAMUSCULAR | Status: AC
  Administered 2019-02-17: 16:00:00 20 mg via INTRAVENOUS
  Filled 2019-02-17: qty 2

## 2019-02-17 MED ORDER — FENTANYL 2500MCG IN NS 250ML (10MCG/ML) PREMIX INFUSION: 25.0000 ug/h | INTRAVENOUS | Status: DC

## 2019-02-17 MED ORDER — OXYCODONE HCL 5 MG PO TABS
10.0000 mg | ORAL_TABLET | ORAL | Status: DC
  Administered 2019-02-17 – 2019-02-21 (×23): 10 mg
  Filled 2019-02-17 (×22): qty 2

## 2019-02-17 MED ORDER — FENTANYL 2500MCG IN NS 250ML (10MCG/ML) PREMIX INFUSION
25.0000 ug/h | INTRAVENOUS | Status: DC
  Administered 2019-02-18: 03:00:00 400 ug/h via INTRAVENOUS
  Administered 2019-02-18: 18:00:00 250 ug/h via INTRAVENOUS
  Administered 2019-02-18: 09:00:00 400 ug/h via INTRAVENOUS
  Administered 2019-02-19: 05:00:00 200 ug/h via INTRAVENOUS
  Administered 2019-02-19: 17:00:00 175 ug/h via INTRAVENOUS
  Filled 2019-02-17 (×5): qty 250

## 2019-02-17 MED ORDER — MIDAZOLAM HCL 2 MG/2ML IJ SOLN
2.0000 mg | INTRAMUSCULAR | Status: AC | PRN
  Administered 2019-02-17: 19:00:00 2 mg via INTRAVENOUS

## 2019-02-17 MED ORDER — MAGNESIUM SULFATE 2 GM/50ML IV SOLN
2.0000 g | Freq: Once | INTRAVENOUS | Status: AC
  Administered 2019-02-17: 2 g via INTRAVENOUS
  Filled 2019-02-17: qty 50

## 2019-02-17 NOTE — Progress Notes (Signed)
Patient's daughter requested purse and belongings which was given to her at 32. Tamsen Meek, RN 02/17/2019 7:21 PM

## 2019-02-17 NOTE — Progress Notes (Signed)
CCM stated that if extra versed does not help then call pharmacy to change sedation (fentanyl to dilaudid drip). Huntley Estelle E, RN 02/17/2019 6:30 PM

## 2019-02-17 NOTE — Progress Notes (Signed)
CRITICAL VALUE ALERT  Critical Value:  D-dimer 5.47, K 3.0  Date & Time Notied:  02/17/2019 1:27 PM  Provider Notified: Yes  Orders Received/Actions taken: None at this time

## 2019-02-17 NOTE — Progress Notes (Addendum)
Nutrition Follow-up RD working remotely.  DOCUMENTATION CODES:   Morbid obesity  INTERVENTION:    Per ASPEN guidelines, recommend continue gastric feedings during prone positioning, keeping HOB elevated (reverse trendelenburg) to at least 10-25 degrees.    Recommend resume TF with Vital High Protein at 25 ml/h, increasing by 10 ml every 4 hours to goal rate of 65 ml/h to provide 1560 kcal (1824 kcal total with propofol), 137 gm protein, 1304 ml free water daily.  NUTRITION DIAGNOSIS:   Inadequate oral intake related to inability to eat as evidenced by NPO status.  Ongoing  GOAL:   Provide needs based on ASPEN/SCCM guidelines  Unmet with TF off  MONITOR:   Vent status, TF tolerance, Labs, Skin, I & O's  ASSESSMENT:   68 yo female with PMH of HLD and depression who was transferred from [facility] on 4/8 with acute respiratory failure requiring intubation and COVID-19 rule out.  COVID-19 testing positive. Patient being proned. TF held for proning.  Patient is currently intubated on ventilator support MV: 10.1 L/min Temp (24hrs), Avg:98.4 F (36.9 C), Min:97.3 F (36.3 C), Max:99 F (37.2 C)  Propofol: 10 ml/hr providing 264 kcal from lipid  Labs reviewed. Potassium 3.4 (L) Medications reviewed and include plaquenil, KCl, Zinc, fentanyl, versed, neosynephrine, propofol.  NUTRITION - FOCUSED PHYSICAL EXAM:  unable to complete  Diet Order:   Diet Order            Diet NPO time specified  Diet effective now              EDUCATION NEEDS:   No education needs have been identified at this time  Skin:  Skin Assessment: Reviewed RN Assessment  Last BM:  4/7  Height:   Ht Readings from Last 1 Encounters:  02/17/19 5\' 4"  (1.626 m)    Weight:   Wt Readings from Last 1 Encounters:  02/17/19 129.7 kg    Ideal Body Weight:  54.5 kg  BMI:  Body mass index is 49.09 kg/m.  Estimated Nutritional Needs:   Kcal:  1450-1850  Protein:  136  gm  Fluid:  2 L    Joaquin Courts, RD, LDN, CNSC Pager [phone] After Hours Pager [phone]

## 2019-02-17 NOTE — Progress Notes (Signed)
Spoke with daughter Arlyss Repress on the phone to give an update on her mom's condition. Per her discussion with Dr. Isaiah Serge yesterday she would like to go ahead and make her mom a full DNR. No de-escalation of care but does not want her mom to receive CPR or medications to prolong her life. Daughter is HPOA and is bringing papers to hospital this afternoon. Tamsen Meek, RN 02/17/2019 2:46 PM

## 2019-02-17 NOTE — Progress Notes (Addendum)
Flipped patient to back with 2RTs and 5RNs in the room. Will leave on back for 8hrs and turn back on abdomen at 0030 02/18/19 Family updated, obtained HCPOA from daughter. Huntley Estelle E, RN 02/17/2019 1630

## 2019-02-17 NOTE — Progress Notes (Addendum)
NAME:  Sherry Becker, MRN:  [id number], DOB:  10/16/1951, LOS: 2 ADMISSION DATE:  02/15/2019, CONSULTATION DATE:  02/15/19 REFERRING MD:  [facility]  CHIEF COMPLAINT:  SOB   Brief History   Sherry Becker is a 68 y.o. female who was admitted 4/8 after transfer from [place] [facility] with acute hypoxic respiratory failure requiring intubation.  She is currently admitted as suspected COVID and is being ruled out.  She was ruled in 4/9  History of present illness   Pt is encephelopathic; therefore, this HPI is obtained from chart review. Sherry Becker is a 68 y.o. female who has a PMH as outlined below (see "past medical history").  [facility]8 with SOB.  Stated that symptoms had been present for 1 month and had minimal improvement.  She had seen PCP and "had a blood test done" (no results known or available).  No known travel history.  No known additional symptoms including fever, cough.  [facility], she had SpO2 of 34% on room air.  She was placed on 15L NRB and [facility] evaluation and management and as a COVID-19 rule out.  [facility], she was intubated for persistent hypoxic respiratory failure requiring 15L NRB with increased WOB.  Labs from [facility] noteable for D-dimer of 2158, CRP 185, lactic acid 3.9, PCT 0.18, WBC 7.4  EKG with NSR and QTc 404.  CXR demonstrated extensive bilateral pulmonary infiltrates.  Past Medical History  No definitive history but per med list from [facility], suspect combo of depression and mood disorder along with HLD.  Probable OSA.  Significant Hospital Events   4/8 > admit, intubated.  Consults:  None.  Procedures:  ETT 4/8 >  CVC 4/9> A Line 4/9> Prone Positioning 4/9 at 18:30  Significant Diagnostic Tests:  CXR 4/8 > extensive bilateral pulmonary infiltrates. LE duplex 4/8 >   Micro Data:  Blood 4/8 >  Sputum 4/8 >  GS: Few Gram + cocci , Few gram + rods 02/16/2019  quantiferon>> RVP 4/8 > negative COVID-19 4/8 > POSITIVE as of 4/9  Antimicrobials:  Azithromycin 4/8 >  Ceftriaxone 4/8 >   Plaquenil 4/6>>  Interim history/subjective:  Currently intubated.  She was proned 4/9 at 18:30. 6 cc TV was initiated 4/10 at 9 am. ABG pending.She is on Fentanyl 400 and versed 4 gtts for sedation. RASS -1 No  Pressors at present, but intermittent pressors needed. She is following commands D dimer 5.47   Objective:  Blood pressure 129/64, pulse (!) 53, temperature 99 F (37.2 C), temperature source Oral, resp. rate (!) 24, height 5\' 4"  (1.626 m), weight 129.7 kg, SpO2 98 %. CVP:  [16 mmHg] 16 mmHg  Vent Mode: PRVC FiO2 (%):  [90 %-100 %] 90 % Set Rate:  [24 bmp-30 bmp] 30 bmp Vt Set:  [330 mL-430 mL] 330 mL PEEP:  [15 cmH20] 15 cmH20 Plateau Pressure:  [26 cmH20-40 cmH20] 26 cmH20   Intake/Output Summary (Last 24 hours) at 02/17/2019 0856 Last data filed at 02/17/2019 0800 Gross per 24 hour  Intake 2527.78 ml  Output 1525 ml  Net 1002.78 ml   Filed Weights   02/15/19 2042 02/16/19 0456 02/17/19 0347  Weight: 131.5 kg 132 kg 129.7 kg    Examination: General: Morbidly obese female sedated and intubated 90% FiO2 and 15 of PEEP, following commands HEENT: Endotracheal tube secure and in place, orogastric tube in place Neuro: Awake and alert  moves all extremities follows commands despite sedation  CV: Heart sounds are distant regular rate and rhythm sinus bradycardia at 56 PULM: Bilateral excursion,Diminished breath sounds throughout FiO2 of 90% with 15 of PEEP generating O2 saturations of 94% P/F ratio 170 ZO:XWRU, non-tender, bsx4 active  Extremities: warm/dry, 1+ edema , no mottling noted Skin: no rashes or lesions   Assessment & Plan:   Acute hypoxic respiratory failure ++ COVID-19 vs PE (though bilateral infiltrates on CXR argues against this). Ruled in for ++ COVID 19 4/9>> Heparin discontinued Received Actmera x 1 on 4/9 Proned 4/9 ay  18:30 CXR 4/10>> Persistent Bilateral patchy infiltrates + 900 cc last 24/ Cumulative + 1300 PF ratio 170 on 4/10 Got lasix x 2 dose 4/9  Plan Intubated 02/15/2019 currently is on 90% FiO2 with 15 of PEEP  Initiate 6 cc TV 4/10 @ 0900 ABG in 1 hour Pulmonary toilet Continue ceftriaxone and Zithromax. Continue Plaquenil and Xanax Monitor cultures  Serial chest x-ray Conservative fluid administration   Cardiovascular Bradycardia Initial 12 Lead with QTc prolongation Intermittent pressor needs none 4/10 needed Plan Continuous bedside QTc monitoring>> 0.45 12 Lead prn Wean pressors for MAP goal of 65  Renal Creatinine 0.73 Decreased urine output ( 30 cc per hour) Hypo K Hypo Mag Plan Replete mag BMET daily Monitor UO Trend Mag and Phos  ID T Max 99 WBC is 2.9 Plan Trend fever and WBC ABX as above Follow micro Re-culture as is clinically indicated  GI Proned 4/9 Plan: Trickle feed tube feeds initiated  Concern for PE - 1 month symptoms of dyspnea along with extremely elevated d-dimer (2158).  Could also be attributed to CoV alone; though D-dimer this high warrants empiric treatment.  Unable to CTA chest due to CoV rule out. With COVID resulting as + on 4/9, lower suspicion of PE Heparin discontinued 4/9 Plan Lower extremity Doppler studies ordered on 02/15/2019 have not been done as of yet    Best Practice:  Diet: TF's. Pain/Anxiety/Delirium protocol (if indicated): Fentanyl gtt / Midazolam PRN / Morphine scheduled.  RASS goal -1. VAP protocol (if indicated): In place. DVT prophylaxis: Heparin gtt. GI prophylaxis: PPI. Glucose control: N/A. Mobility: Bedrest. Code Status: Full. Family Communication: 02/16/2019 we will need update family via telephone today. Disposition: ICU.  Labs   CBC: Recent Labs  Lab 02/16/19 0101 02/16/19 0258 02/16/19 1230 02/16/19 2107 02/17/19 0247  WBC 6.4  --   --   --  2.9*  NEUTROABS 4.5  --   --   --  1.5*  HGB 13.0  13.3 12.6 16.3* 11.8*  HCT 42.7 39.0 37.0 48.0* 38.9  MCV 96.8  --   --   --  96.0  PLT 161  --   --   --  155   Basic Metabolic Panel: Recent Labs  Lab 02/16/19 0101 02/16/19 0258 02/16/19 1230 02/16/19 2107 02/17/19 0247  NA 141 140 142 141 143  K 3.0* 3.0* 3.0* 2.9* 2.8*  CL 99  --   --   --  99  CO2 31  --   --   --  33*  GLUCOSE 120*  --   --   --  98  BUN 9  --   --   --  9  CREATININE 0.73  --   --   --  0.73  CALCIUM 7.1*  --   --   --  6.9*  MG 1.7  --   --   --  1.7  PHOS 1.8*  --   --   --  2.7   GFR: Estimated Creatinine Clearance: 90 mL/min (by C-G formula based on SCr of 0.73 mg/dL). Recent Labs  Lab 02/16/19 0101 02/17/19 0247  WBC 6.4 2.9*   Liver Function Tests: Recent Labs  Lab 02/16/19 0101  AST 25  ALT 23  ALKPHOS 108  BILITOT 0.8  PROT 5.5*  ALBUMIN 2.9*   No results for input(s): LIPASE, AMYLASE in the last 168 hours. No results for input(s): AMMONIA in the last 168 hours. ABG    Component Value Date/Time   PHART 7.489 (H) 02/16/2019 2107   PCO2ART 45.6 02/16/2019 2107   PO2ART 252.0 (H) 02/16/2019 2107   HCO3 34.7 (H) 02/16/2019 2107   TCO2 36 (H) 02/16/2019 2107   O2SAT 100.0 02/16/2019 2107    Coagulation Profile: Recent Labs  Lab 02/16/19 0101  INR 1.1   Cardiac Enzymes: No results for input(s): CKTOTAL, CKMB, CKMBINDEX, TROPONINI in the last 168 hours. HbA1C: No results found for: HGBA1C CBG: Recent Labs  Lab 02/16/19 1641 02/16/19 2009 02/16/19 2329 02/17/19 0445 02/17/19 0835  GLUCAP 135* 115* 117* 90 94     Critical care time: 45 min.    Bevelyn Ngo, AGACNP-BC [facility] Pager [phone]  If no answer page [phone] 02/17/2019, 8:56 AM   PCCM Attending:   68 yo FM, AHRF on MV, COVID+ ARDS  BP (!) 129/59   Pulse (!) 58   Temp 98.6 F (37 C) (Oral)   Resp (!) 30   Ht 5\' 4"  (1.626 m)   Wt 129.7 kg   SpO2 97%   BMI 49.09 kg/m   Gen: elderly fm, intubated, sedated on vent   Cardiac: Lungs:   CXR: reviewed - patchy infiltrates, The patient's images have been independently reviewed by me.    A: AHRF on MV  COVID19 ARDS  Intermittent Bradycardia  Stable UOP  P: Proned, goal at 16 hours and un-prone for 8 hours with plans to repeat ARDS protocol Oxy 10 q4H to reduce drip needs Replete Ca2+ and phos  Given actmera yesterday  Attempt euvolemia, lasix 20mg  today   This patient is critically ill with multiple organ system failure; which, requires frequent high complexity decision making, assessment, support, evaluation, and titration of therapies. This was completed through the application of advanced monitoring technologies and extensive interpretation of multiple databases. During this encounter critical care time was devoted to patient care services described in this note for 34 minutes.  Josephine Igo, DO [facility] 02/17/2019 2:07 PM  Personal pager: [phone] If unanswered, please page CCM On-call: #[phone]

## 2019-02-18 ENCOUNTER — Encounter (HOSPITAL_COMMUNITY): Payer: Managed Care, Other (non HMO)

## 2019-02-18 ENCOUNTER — Inpatient Hospital Stay (HOSPITAL_COMMUNITY): Payer: Medicare Other

## 2019-02-18 DIAGNOSIS — R579 Shock, unspecified: Secondary | ICD-10-CM

## 2019-02-18 LAB — BASIC METABOLIC PANEL
Anion gap: 13 (ref 5–15)
BUN: 11 mg/dL (ref 8–23)
CO2: 31 mmol/L (ref 22–32)
Calcium: 7.6 mg/dL — ABNORMAL LOW (ref 8.9–10.3)
Chloride: 100 mmol/L (ref 98–111)
Creatinine, Ser: 0.82 mg/dL (ref 0.44–1.00)
GFR calc Af Amer: >60 mL/min (ref >60)
GFR calc non Af Amer: >60 mL/min (ref >60)
Glucose, Bld: 101 mg/dL — ABNORMAL HIGH (ref 70–99)
Potassium: 3.6 mmol/L (ref 3.5–5.1)
Sodium: 144 mmol/L (ref 135–145)

## 2019-02-18 LAB — CBC WITH DIFFERENTIAL/PLATELET
Abs Immature Granulocytes: 0 10*3/uL (ref 0.00–0.07)
Basophils Absolute: 0 10*3/uL (ref 0.0–0.1)
Basophils Relative: 0 %
Eosinophils Absolute: 0 10*3/uL (ref 0.0–0.5)
Eosinophils Relative: 0 %
HCT: 41.1 % (ref 36.0–46.0)
Hemoglobin: 12 g/dL (ref 12.0–15.0)
Lymphocytes Relative: 14 %
Lymphs Abs: 0.4 10*3/uL — ABNORMAL LOW (ref 0.7–4.0)
MCH: 29.1 pg (ref 26.0–34.0)
MCHC: 29.2 g/dL — ABNORMAL LOW (ref 30.0–36.0)
MCV: 99.8 fL (ref 80.0–100.0)
Monocytes Absolute: 0.1 10*3/uL (ref 0.1–1.0)
Monocytes Relative: 4 %
Neutro Abs: 2.5 10*3/uL (ref 1.7–7.7)
Neutrophils Relative %: 82 %
Platelets: 177 10*3/uL (ref 150–400)
RBC: 4.12 MIL/uL (ref 3.87–5.11)
RDW: 15 % (ref 11.5–15.5)
WBC: 3 10*3/uL — ABNORMAL LOW (ref 4.0–10.5)
nRBC: 0 % (ref 0.0–0.2)
nRBC: 0 /100 WBC

## 2019-02-18 LAB — HEPATIC FUNCTION PANEL
ALT: 19 U/L (ref 0–44)
AST: 24 U/L (ref 15–41)
Albumin: 2.5 g/dL — ABNORMAL LOW (ref 3.5–5.0)
Alkaline Phosphatase: 95 U/L (ref 38–126)
Bilirubin, Direct: 0.2 mg/dL (ref 0.0–0.2)
Indirect Bilirubin: 0.5 mg/dL (ref 0.3–0.9)
Total Bilirubin: 0.7 mg/dL (ref 0.3–1.2)
Total Protein: 5.4 g/dL — ABNORMAL LOW (ref 6.5–8.1)

## 2019-02-18 LAB — POCT I-STAT 7, (LYTES, BLD GAS, ICA,H+H)
Acid-Base Excess: 6 mmol/L — ABNORMAL HIGH (ref 0.0–2.0)
Bicarbonate: 34.2 mmol/L — ABNORMAL HIGH (ref 20.0–28.0)
Calcium, Ion: 1.1 mmol/L — ABNORMAL LOW (ref 1.15–1.40)
HCT: 36 % (ref 36.0–46.0)
Hemoglobin: 12.2 g/dL (ref 12.0–15.0)
O2 Saturation: 99 %
Potassium: 3.9 mmol/L (ref 3.5–5.1)
Sodium: 143 mmol/L (ref 135–145)
TCO2: 36 mmol/L — ABNORMAL HIGH (ref 22–32)
pCO2 arterial: 64.4 mmHg — ABNORMAL HIGH (ref 32.0–48.0)
pH, Arterial: 7.333 — ABNORMAL LOW (ref 7.350–7.450)
pO2, Arterial: 134 mmHg — ABNORMAL HIGH (ref 83.0–108.0)

## 2019-02-18 LAB — GLUCOSE, CAPILLARY
Glucose-Capillary: 104 mg/dL — ABNORMAL HIGH (ref 70–99)
Glucose-Capillary: 107 mg/dL — ABNORMAL HIGH (ref 70–99)
Glucose-Capillary: 110 mg/dL — ABNORMAL HIGH (ref 70–99)
Glucose-Capillary: 130 mg/dL — ABNORMAL HIGH (ref 70–99)
Glucose-Capillary: 87 mg/dL (ref 70–99)
Glucose-Capillary: 93 mg/dL (ref 70–99)

## 2019-02-18 LAB — C-REACTIVE PROTEIN: CRP: 13.3 mg/dL — ABNORMAL HIGH (ref <1.0)

## 2019-02-18 LAB — CULTURE, RESPIRATORY W GRAM STAIN: Culture: NORMAL

## 2019-02-18 LAB — PHOSPHORUS: Phosphorus: 3.7 mg/dL (ref 2.5–4.6)

## 2019-02-18 LAB — LACTATE DEHYDROGENASE: LDH: 243 U/L — ABNORMAL HIGH (ref 98–192)

## 2019-02-18 LAB — MAGNESIUM: Magnesium: 1.9 mg/dL (ref 1.7–2.4)

## 2019-02-18 LAB — FERRITIN: Ferritin: 217 ng/mL (ref 11–307)

## 2019-02-18 MED ORDER — FUROSEMIDE 10 MG/ML IJ SOLN
40.0000 mg | Freq: Three times a day (TID) | INTRAMUSCULAR | Status: AC
  Administered 2019-02-18 – 2019-02-19 (×3): 40 mg via INTRAVENOUS
  Filled 2019-02-18 (×3): qty 4

## 2019-02-18 MED ORDER — POTASSIUM CHLORIDE 20 MEQ/15ML (10%) PO SOLN
40.0000 meq | Freq: Once | ORAL | Status: AC
  Administered 2019-02-18: 12:00:00 40 meq via ORAL
  Filled 2019-02-18: qty 30

## 2019-02-18 MED ORDER — MAGNESIUM SULFATE 2 GM/50ML IV SOLN
2.0000 g | Freq: Once | INTRAVENOUS | Status: AC
  Administered 2019-02-18: 2 g via INTRAVENOUS
  Filled 2019-02-18: qty 50

## 2019-02-18 MED ORDER — FENTANYL 100 MCG/HR TD PT72
1.0000 | MEDICATED_PATCH | TRANSDERMAL | Status: DC
  Administered 2019-02-18: 11:00:00 1 via TRANSDERMAL
  Filled 2019-02-18: qty 1

## 2019-02-18 MED ORDER — MIDAZOLAM 50MG/50ML (1MG/ML) PREMIX INFUSION
0.5000 mg/h | INTRAVENOUS | Status: DC
  Administered 2019-02-18: 4 mg/h via INTRAVENOUS
  Administered 2019-02-19: 7 mg/h via INTRAVENOUS
  Administered 2019-02-19: 4 mg/h via INTRAVENOUS
  Administered 2019-02-19: 6 mg/h via INTRAVENOUS
  Filled 2019-02-18 (×3): qty 50

## 2019-02-18 MED ORDER — POTASSIUM CHLORIDE 20 MEQ/15ML (10%) PO SOLN
20.0000 meq | Freq: Two times a day (BID) | ORAL | Status: DC
  Administered 2019-02-18 – 2019-02-20 (×6): 20 meq
  Filled 2019-02-18 (×7): qty 15

## 2019-02-18 NOTE — Progress Notes (Signed)
RT NOTE: Patient's arterial line was lost and not functioning and had to be removed. RT attempted 2x to obtain an arterial line but was unsuccessful. MD has been notified. Vitals are stable. RT will continue to monitor.

## 2019-02-18 NOTE — Progress Notes (Signed)
Pt positioned in prone position with 6 RN's and 2 RT for airway management. Pt was awake and able to respond. Positioned for comfort and pt communicated she was pain free and comfortable. Pillows and rolled up towels and blankets used to pad and prop with particular care to maintain body alignment. Procedure completed by 1am

## 2019-02-18 NOTE — Progress Notes (Signed)
PCCM  Evaluated pt at 8 PM Ventilator alarm continuously going due to VTi/ VTE mismatch Deflated cuff completely. Took 10 cc to get VTi/ VTe match  Ruled in for ++ COVID 19 4/9>> Heparin discontinued Received Actmera x 1 on 4/9 Continues on fentanyl and versed for sedation  Reviewed CXR 4/11: Worsening pneumonia throughout the RIGHT lung and in the LEFT UPPER LOBE. Stable LEFT LOWER LOBE pneumonia.    Plan: 1. RT to replace Arterial line>> for two reasons  - pt is being proned at 1:30 AM  - and frequent ABGs 2. Positioned pt with good lung down (left lateral decub in reverse trend to optimize V/Q match) 3. RT to check cuff pressure and optimize  Signed Dr Newell [facility]

## 2019-02-18 NOTE — Procedures (Signed)
Arterial Catheter Insertion Procedure Note Sherry Becker [id number] 01-25-1951  Procedure: Insertion of Arterial Catheter  Indications: Blood pressure monitoring  Procedure Details Consent: Unable to obtain consent because of altered level of consciousness. Time Out: Verified patient identification, verified procedure, site/side was marked, verified correct patient position, special equipment/implants available, medications/allergies/relevent history reviewed, required imaging and test results available.  Performed  Maximum sterile technique was used including antiseptics, cap, gloves, gown, hand hygiene, mask and sheet. Skin prep: Chlorhexidine; local anesthetic administered 20 gauge catheter was inserted into left radial artery using the Seldinger technique. ULTRASOUND GUIDANCE USED: NO Evaluation Blood flow good; BP tracing good. Complications: No apparent complications.   Lang Snow 02/18/2019

## 2019-02-18 NOTE — Progress Notes (Signed)
Flipped patient back to supine with 1RT and 5RNs in the room.  Will leave on back for 8 hrs and turn back to prone at 130 02/19/2019.  Pt will respond to voice and comfortable.  Will continue to closely monitor patient.

## 2019-02-18 NOTE — Progress Notes (Signed)
Echocardiogram cancelled per Dr. Acharya.  Echo will not change medical treatment of supportive care. 

## 2019-02-18 NOTE — Progress Notes (Addendum)
NAME:  Sherry Becker, MRN:  [id number], DOB:  1951/05/21, LOS: 3 ADMISSION DATE:  02/15/2019, CONSULTATION DATE:  02/15/19 REFERRING MD:  [facility]  CHIEF COMPLAINT:  SOB   Brief History   Sherry Becker is a 68 y.o. female who was admitted 4/8 after transfer from [place] [facility] with acute hypoxic respiratory failure requiring intubation.  She is currently admitted as suspected COVID and is being ruled out.  She was ruled in 4/9 Made partial Code 4/10>> Continue aggressive care   History of present illness   Pt is encephelopathic; therefore, this HPI is obtained from chart review. Sherry Becker is a 68 y.o. female who has a PMH as outlined below (see "past medical history").  She presented to [facility] 4/8 with SOB.  Stated that symptoms had been present for 1 month and had minimal improvement.  She had seen PCP and "had a blood test done" (no results known or available).  No known travel history.  No known additional symptoms including fever, cough.  In ED at [facility], she had SpO2 of 34% on room air.  She was placed on 15L NRB and was transported to [facility] for further evaluation and management and as a COVID-19 rule out.  Upon arrival to [facility], she was intubated for persistent hypoxic respiratory failure requiring 15L NRB with increased WOB.  Labs from [place] noteable for D-dimer of 2158, CRP 185, lactic acid 3.9, PCT 0.18, WBC 7.4  EKG with NSR and QTc 404.  CXR demonstrated extensive bilateral pulmonary infiltrates.  Past Medical History  No definitive history but per med list from [place], suspect combo of depression and mood disorder along with HLD.  Probable OSA.  Significant Hospital Events   4/8 > admit, intubated.  Consults:  None.  Procedures:  ETT 4/8 >  CVC 4/9> A Line 4/9> Prone Positioning 4/9 at 18:30  Significant Diagnostic Tests:  CXR 4/8 > extensive bilateral pulmonary infiltrates. LE duplex 4/8 >   Micro Data:  Blood 4/8 >  Sputum 4/8 >  GS:  Few Gram + cocci , Few gram + rods>> Normal flora 02/16/2019 quantiferon>> RVP 4/8 > negative COVID-19 4/8 > POSITIVE as of 4/9 4/8 Urine >> No growth  Antimicrobials:  Azithromycin 4/8 >  Ceftriaxone 4/8 >   Plaquenil 4/6>>  Interim history/subjective:  Currently intubated.  Remains on 90%  She was proned 4/9 at 18:30.  6 cc TV was initiated 4/10 at 9 am. . She is on Fentanyl 400 and versed 4 gtts for sedation. RASS -1 Added Oxy 4/10 to see if we can decrease Fentanyl gtt No  Pressors at present, but intermittent pressors needed.  She is following commands D dimer 5.47  CXR : Personally reviewed with worsening pneumonia  RIGHT lung and in the LEFT UPPER LOBE. Stable LEFT LOWER LOBE pneumonia. Stable cardiomegaly. Pulmonary venous hypertension without overt edema.  Objective:  Blood pressure (!) 145/63, pulse 65, temperature 98.7 F (37.1 C), temperature source Axillary, resp. rate (!) 30, height  (1.626 m), weight 130 kg, SpO2 96 %.    Vent Mode: PRVC FiO2 (%):  [90 %] 90 % Set Rate:  [30 bmp] 30 bmp Vt Set:  [330 mL] 330 mL PEEP:  [15 cmH20] 15 cmH20 Plateau Pressure:  [25 cmH20-26 cmH20] 25 cmH20   Intake/Output Summary (Last 24 hours) at 02/18/2019 0940 Last data filed at 02/18/2019 0800 Gross per 24 hour  Intake 1945.25 ml  Output 1192.5 ml  Net 752.75 ml  Filed Weights   02/16/19 0456 02/17/19 0347 02/18/19 0500  Weight: 132 kg 129.7 kg 130 kg    Examination: General: Morbidly obese female sedated and intubated 90% FiO2 and 15 of PEEP, following commands, prone HEENT: Endotracheal tube secure and in place, orogastric tube in place, edematous face Neuro: Awake and alert moves all extremities follows commands despite sedation  CV: Heart sounds are distant regular rate and rhythm sinus rhythm 66 PULM: Bilateral excursion,Diminished breath sounds throughout FiO2 of 90% with 15 of PEEP generating O2 saturations of 96% P/F Ratio>> ABG pending HY:QMVH,  non-tender, bsx4 active  Extremities: warm/dry, 1+ edema , no mottling noted Skin: no rashes or lesions   Assessment & Plan:   Acute hypoxic respiratory failure ++ COVID-19 vs PE (though bilateral infiltrates on CXR argues against this). Ruled in for ++ COVID 19 4/9>> Heparin discontinued Received Actmera x 1 on 4/9 Proned 4/9 ay 18:30 CXR 4/10>> Persistent Bilateral patchy infiltrates + 500 cc last 24/ Cumulative + 1800 PF ratio pending Got lasix x 2 dose 4/9  CXR with worsening pneumonia Remains on Fentanyl at 400 and Versed at 4  Plan Intubated 02/15/2019 currently is on 90% FiO2 with 15 of PEEP  Initiate 6 cc TV 4/10 @ 0900 ABG now Aggressive Pulmonary toilet Continue ceftriaxone and Zithromax. Continue Plaquenil and Xanax Monitor cultures  Serial chest x-ray Conservative fluid administration   Cardiovascular Bradycardia resolving Initial 12 Lead with QTc prolongation Intermittent pressor needs none 4/10 needed Plan Continuous bedside QTc monitoring>> 0.45 12 Lead prn Wean pressors for MAP goal of 65  Renal Creatinine 0.82 Decreased urine output  Improving K Hypo Mag Plan Replete mag BMET daily Monitor UO Trend Mag and Phos  ID T Max 99.1 WBC is 3 Plan Trend fever and WBC ABX as above Follow micro Re-culture as is clinically indicated  GI Proned 4/9 Plan: Trickle feed tube feeds initiated SUP protonix  Concern for PE - 1 month symptoms of dyspnea along with extremely elevated d-dimer (2158).  Could also be attributed to CoV alone; though D-dimer this high warrants empiric treatment.  Unable to CTA chest due to CoV rule out. With COVID resulting as + on 4/9, lower suspicion of PE Heparin discontinued 4/9 Plan Lower extremity Doppler studies ordered on 02/15/2019 have not been done as of yet Lovenox for DVT prophylaxis  Discussion Sedation continues to be an issue. She has intermittent periods of  breath stacking She remains on 4 of versed and  400 fentanyl Oxy was added 4/10 to potentiate weaning fentanyl Will add Fentanyl patch to try and lower fentanyl gtt rate  Best Practice:  Diet: TF's. Pain/Anxiety/Delirium protocol (if indicated): Fentanyl gtt / Midazolam PRN / Morphine scheduled.  RASS goal -1. VAP protocol (if indicated): In place. DVT prophylaxis: Heparin gtt. GI prophylaxis: PPI. Glucose control: N/A. Mobility: Bedrest. Code Status: Full. Family Communication: 02/16/2019 we will need update family via telephone today. Disposition: ICU.  Labs   CBC: Recent Labs  Lab 02/16/19 0101  02/16/19 1230 02/16/19 2107 02/17/19 0247 02/17/19 0918 02/18/19 0427  WBC 6.4  --   --   --  2.9*  --  3.0*  NEUTROABS 4.5  --   --   --  1.5*  --  2.5  HGB 13.0   < > 12.6 16.3* 11.8* 11.9* 12.0  HCT 42.7   < > 37.0 48.0* 38.9 35.0* 41.1  MCV 96.8  --   --   --  96.0  --  99.8  PLT 161  --   --   --  155  --  177   < > = values in this interval not displayed.   Basic Metabolic Panel: Recent Labs  Lab 02/16/19 0101  02/16/19 2107 02/17/19 0247 02/17/19 0918 02/17/19 1100 02/18/19 0427  NA 141   < > 141 143 143 144 144  K 3.0*   < > 2.9* 2.8* 3.4* 3.0* 3.6  CL 99  --   --  99  --  99 100  CO2 31  --   --  33*  --  32 31  GLUCOSE 120*  --   --  98  --  99 101*  BUN 9  --   --  9  --  9 11  CREATININE 0.73  --   --  0.73  --  0.79 0.82  CALCIUM 7.1*  --   --  6.9*  --  7.1* 7.6*  MG 1.7  --   --  1.7  --   --  1.9  PHOS 1.8*  --   --  2.7  --   --  3.7   < > = values in this interval not displayed.   GFR: Estimated Creatinine Clearance: 87.9 mL/min (by C-G formula based on SCr of 0.82 mg/dL). Recent Labs  Lab 02/16/19 0101 02/17/19 0247 02/18/19 0427  WBC 6.4 2.9* 3.0*   Liver Function Tests: Recent Labs  Lab 02/16/19 0101 02/18/19 0427  AST 25 24  ALT 23 19  ALKPHOS 108 95  BILITOT 0.8 0.7  PROT 5.5* 5.4*  ALBUMIN 2.9* 2.5*   No results for input(s): LIPASE, AMYLASE in the last 168 hours. No  results for input(s): AMMONIA in the last 168 hours. ABG    Component Value Date/Time   PHART 7.457 (H) 02/17/2019 0918   PCO2ART 50.7 (H) 02/17/2019 0918   PO2ART 154.0 (H) 02/17/2019 0918   HCO3 35.8 (H) 02/17/2019 0918   TCO2 37 (H) 02/17/2019 0918   O2SAT 99.0 02/17/2019 0918    Coagulation Profile: Recent Labs  Lab 02/16/19 0101  INR 1.1   Cardiac Enzymes: No results for input(s): CKTOTAL, CKMB, CKMBINDEX, TROPONINI in the last 168 hours. HbA1C: No results found for: HGBA1C CBG: Recent Labs  Lab 02/17/19 1627 02/17/19 2048 02/17/19 2355 02/18/19 0417 02/18/19 0806  GLUCAP 106* 96 107* 87 93     Critical care time: 45 min.    Bevelyn NgoSarah F. Groce, AGACNP-BC [facility] Pager [phone]  If no answer page [phone] 02/18/2019, 9:40 AM   ________________________________________________________________________   PCCM Attending:   68 yo FM, intubated on vent, COVID positive, proned, ARDS  BP (!) 124/52   Pulse 66   Temp 99.2 F (37.3 C) (Axillary)   Resp (!) 30   Ht 5\' 4"  (1.626 m)   Wt 130 kg   SpO2 92%   BMI 49.19 kg/m   Gen: fm, intubated, proned  Lungs: vent wave forms reviewed, BL vent breaths  Cardiac: sinus on tele, regular   ABG    Component Value Date/Time   PHART 7.333 (L) 02/18/2019 1200   PCO2ART 64.4 (H) 02/18/2019 1200   PO2ART 134.0 (H) 02/18/2019 1200   HCO3 34.2 (H) 02/18/2019 1200   TCO2 36 (H) 02/18/2019 1200   O2SAT 99.0 02/18/2019 1200    A:  AHRF, COVID19, BL PNA, ARDS on MV, proned (today 2nd round)  intermitent bradycardia   P: Currently remains proned for second  session  ETT was readvanced  Attempt euvolemia, lasix 40mg  Q8 X 3 doses LTVV ARDS protocol PaO2 improved so will decrease fio2    This patient is critically ill with multiple organ system failure; which, requires frequent high complexity decision making, assessment, support, evaluation, and titration of therapies. This was completed through the  application of advanced monitoring technologies and extensive interpretation of multiple databases. During this encounter critical care time was devoted to patient care services described in this note for 36 minutes.   Josephine Igo, DO [facility] 02/18/2019 3:28 PM  Personal pager: [phone] If unanswered, please page CCM On-call: #[phone]

## 2019-02-18 NOTE — Progress Notes (Signed)
Spoke with Dr. Darrick Penna regarding new order for reducing of Fentanyl to . Told to "keep at current rate through the night" and to be readdressed later tomorrow.

## 2019-02-19 ENCOUNTER — Inpatient Hospital Stay (HOSPITAL_COMMUNITY): Payer: Medicare Other

## 2019-02-19 LAB — POCT I-STAT 7, (LYTES, BLD GAS, ICA,H+H)
Acid-Base Excess: 11 mmol/L — ABNORMAL HIGH (ref 0.0–2.0)
Bicarbonate: 38.6 mmol/L — ABNORMAL HIGH (ref 20.0–28.0)
Calcium, Ion: 1.08 mmol/L — ABNORMAL LOW (ref 1.15–1.40)
HCT: 38 % (ref 36.0–46.0)
Hemoglobin: 12.9 g/dL (ref 12.0–15.0)
O2 Saturation: 97 %
Patient temperature: 99
Potassium: 4 mmol/L (ref 3.5–5.1)
Sodium: 141 mmol/L (ref 135–145)
TCO2: 41 mmol/L — ABNORMAL HIGH (ref 22–32)
pCO2 arterial: 67.2 mmHg (ref 32.0–48.0)
pH, Arterial: 7.368 (ref 7.350–7.450)
pO2, Arterial: 99 mmHg (ref 83.0–108.0)

## 2019-02-19 LAB — COMPREHENSIVE METABOLIC PANEL
ALT: 25 U/L (ref 0–44)
AST: 31 U/L (ref 15–41)
Albumin: 2.7 g/dL — ABNORMAL LOW (ref 3.5–5.0)
Alkaline Phosphatase: 101 U/L (ref 38–126)
Anion gap: 12 (ref 5–15)
BUN: 8 mg/dL (ref 8–23)
CO2: 33 mmol/L — ABNORMAL HIGH (ref 22–32)
Calcium: 7.9 mg/dL — ABNORMAL LOW (ref 8.9–10.3)
Chloride: 100 mmol/L (ref 98–111)
Creatinine, Ser: 0.74 mg/dL (ref 0.44–1.00)
GFR calc Af Amer: >60 mL/min (ref >60)
GFR calc non Af Amer: >60 mL/min (ref >60)
Glucose, Bld: 115 mg/dL — ABNORMAL HIGH (ref 70–99)
Potassium: 3.9 mmol/L (ref 3.5–5.1)
Sodium: 145 mmol/L (ref 135–145)
Total Bilirubin: 1 mg/dL (ref 0.3–1.2)
Total Protein: 5.4 g/dL — ABNORMAL LOW (ref 6.5–8.1)

## 2019-02-19 LAB — CBC WITH DIFFERENTIAL/PLATELET
Abs Immature Granulocytes: 0.02 10*3/uL (ref 0.00–0.07)
Basophils Absolute: 0 10*3/uL (ref 0.0–0.1)
Basophils Relative: 1 %
Eosinophils Absolute: 0 10*3/uL (ref 0.0–0.5)
Eosinophils Relative: 1 %
HCT: 43.7 % (ref 36.0–46.0)
Hemoglobin: 12.7 g/dL (ref 12.0–15.0)
Immature Granulocytes: 1 %
Lymphocytes Relative: 22 %
Lymphs Abs: 0.9 10*3/uL (ref 0.7–4.0)
MCH: 28.8 pg (ref 26.0–34.0)
MCHC: 29.1 g/dL — ABNORMAL LOW (ref 30.0–36.0)
MCV: 99.1 fL (ref 80.0–100.0)
Monocytes Absolute: 0.3 10*3/uL (ref 0.1–1.0)
Monocytes Relative: 7 %
Neutro Abs: 2.8 10*3/uL (ref 1.7–7.7)
Neutrophils Relative %: 68 %
Platelets: 192 10*3/uL (ref 150–400)
RBC: 4.41 MIL/uL (ref 3.87–5.11)
RDW: 14.5 % (ref 11.5–15.5)
WBC: 4 10*3/uL (ref 4.0–10.5)
nRBC: 0 % (ref 0.0–0.2)

## 2019-02-19 LAB — GLUCOSE, CAPILLARY
Glucose-Capillary: 104 mg/dL — ABNORMAL HIGH (ref 70–99)
Glucose-Capillary: 120 mg/dL — ABNORMAL HIGH (ref 70–99)
Glucose-Capillary: 122 mg/dL — ABNORMAL HIGH (ref 70–99)
Glucose-Capillary: 125 mg/dL — ABNORMAL HIGH (ref 70–99)
Glucose-Capillary: 125 mg/dL — ABNORMAL HIGH (ref 70–99)
Glucose-Capillary: 141 mg/dL — ABNORMAL HIGH (ref 70–99)

## 2019-02-19 LAB — LACTATE DEHYDROGENASE: LDH: 249 U/L — ABNORMAL HIGH (ref 98–192)

## 2019-02-19 LAB — FERRITIN: Ferritin: 211 ng/mL (ref 11–307)

## 2019-02-19 LAB — C-REACTIVE PROTEIN: CRP: 7.6 mg/dL — ABNORMAL HIGH (ref <1.0)

## 2019-02-19 LAB — TRIGLYCERIDES: Triglycerides: 308 mg/dL — ABNORMAL HIGH (ref <150)

## 2019-02-19 LAB — D-DIMER, QUANTITATIVE (NOT AT ARMC): D-Dimer, Quant: 5.88 ug/mL-FEU — ABNORMAL HIGH (ref 0.00–0.50)

## 2019-02-19 LAB — PHOSPHORUS: Phosphorus: 4.1 mg/dL (ref 2.5–4.6)

## 2019-02-19 LAB — MAGNESIUM: Magnesium: 1.9 mg/dL (ref 1.7–2.4)

## 2019-02-19 MED ORDER — SODIUM CHLORIDE 0.9 % IV SOLN
0.9400 mg/kg/h | INTRAVENOUS | Status: DC
  Administered 2019-02-19: 0.94 mg/kg/h via INTRAVENOUS
  Administered 2019-02-19: 10:00:00 0.5 mg/kg/h via INTRAVENOUS
  Administered 2019-02-19: 0.94 mg/kg/h via INTRAVENOUS
  Administered 2019-02-19: 0.5 mg/kg/h via INTRAVENOUS
  Administered 2019-02-19 – 2019-02-22 (×16): 0.94 mg/kg/h via INTRAVENOUS
  Filled 2019-02-19 (×49): qty 5

## 2019-02-19 MED ORDER — ACETAZOLAMIDE SODIUM 500 MG IJ SOLR
250.0000 mg | Freq: Once | INTRAMUSCULAR | Status: AC
  Administered 2019-02-19: 250 mg via INTRAVENOUS
  Filled 2019-02-19: qty 250

## 2019-02-19 MED ORDER — FUROSEMIDE 10 MG/ML IJ SOLN
40.0000 mg | Freq: Once | INTRAMUSCULAR | Status: AC
  Administered 2019-02-19: 40 mg via INTRAVENOUS
  Filled 2019-02-19: qty 4

## 2019-02-19 MED ORDER — VECURONIUM BROMIDE 10 MG IV SOLR
INTRAVENOUS | Status: AC
  Administered 2019-02-19: 10 mg
  Filled 2019-02-19: qty 10

## 2019-02-19 MED ORDER — CALCIUM GLUCONATE-NACL 1-0.675 GM/50ML-% IV SOLN
1.0000 g | Freq: Once | INTRAVENOUS | Status: AC
  Administered 2019-02-19: 1000 mg via INTRAVENOUS
  Filled 2019-02-19: qty 50

## 2019-02-19 MED ORDER — POLYETHYLENE GLYCOL 3350 17 G PO PACK
17.0000 g | PACK | Freq: Every day | ORAL | Status: DC
  Administered 2019-02-19 – 2019-02-24 (×3): 17 g via ORAL
  Filled 2019-02-19 (×4): qty 1

## 2019-02-19 MED ORDER — VECURONIUM BROMIDE 10 MG IV SOLR: 10.0000 mg | Freq: Once | INTRAVENOUS | Status: AC

## 2019-02-19 MED ORDER — MIDAZOLAM BOLUS VIA INFUSION
2.0000 mg | Freq: Once | INTRAVENOUS | Status: AC
  Administered 2019-02-19: 02:00:00 2 mg via INTRAVENOUS

## 2019-02-19 MED ORDER — BISACODYL 10 MG RE SUPP
10.0000 mg | Freq: Every day | RECTAL | Status: DC | PRN
  Administered 2019-02-19: 16:00:00 10 mg via RECTAL
  Filled 2019-02-19: qty 1

## 2019-02-19 NOTE — Progress Notes (Addendum)
NAME:  Sherry Becker, MRN:  [id number], DOB:  08-Sep-1951, LOS: 4 ADMISSION DATE:  02/15/2019, CONSULTATION DATE:  02/15/19 REFERRING MD:  [facility]  CHIEF COMPLAINT:  SOB   Brief History   Sherry Becker is a 68 y.o. female who was admitted 4/8 after transfer from [place] [facility] with acute hypoxic respiratory failure requiring intubation.  She is currently admitted as suspected COVID and is being ruled out.  She was ruled in 4/9 Made partial Code 4/10>> Continue aggressive care   History of present illness   Pt is encephelopathic; therefore, this HPI is obtained from chart review. Sherry Becker is a 68 y.o. female who has a PMH as outlined below (see "past medical history").  She presented to [facility] 4/8 with SOB.  Stated that symptoms had been present for 1 month and had minimal improvement.  She had seen PCP and "had a blood test done" (no results known or available).  No known travel history.  No known additional symptoms including fever, cough.  In ED at [facility], she had SpO2 of 34% on room air.  She was placed on 15L NRB and was transported to [facility] for further evaluation and management and as a COVID-19 rule out.  Upon arrival to [facility], she was intubated for persistent hypoxic respiratory failure requiring 15L NRB with increased WOB.  Labs from [place] noteable for D-dimer of 2158, CRP 185, lactic acid 3.9, PCT 0.18, WBC 7.4  EKG with NSR and QTc 404.  CXR demonstrated extensive bilateral pulmonary infiltrates.  Past Medical History  No definitive history but per med list from [place], suspect combo of depression and mood disorder along with HLD.  Probable OSA.  Significant Hospital Events   4/8 > admit, intubated.  Consults:  None.  Procedures:  ETT 4/8 >  CVC 4/9> A Line 4/9> Prone Positioning 4/9 at 18:30  Significant Diagnostic Tests:  CXR 4/8 > extensive bilateral pulmonary infiltrates. LE duplex 4/8 >   Micro Data:  Blood 4/8 >  Sputum 4/8 >  GS:  Few Gram + cocci , Few gram + rods>> Normal flora 02/16/2019 quantiferon>> RVP 4/8 > negative COVID-19 4/8 > POSITIVE as of 4/9 4/8 Urine >> No growth  Antimicrobials:  Azithromycin 4/8 >  Ceftriaxone 4/8 >   Plaquenil 4/6>>  Interim history/subjective:  Remains  intubated.  Weaned to 70%  She was proned 4/9 at 18:30.  6 cc TV was initiated 4/10 at 9 am. . Continued high sedation, started on Ketamine 4/12 with goal of weaning Versed/ Fentanyl Added Oxy 4/10 to see if we can decrease Fentanyl gtt No  Pressors at present, but intermittent pressors needed.  She is following commands D dimer 5.88  CXR : Personally reviewed with worsening pneumonia  RIGHT lung and in the LEFT UPPER LOBE. Stable LEFT LOWER LOBE pneumonia.NO sigmificant changes over night PF ratio 152   Objective:  Blood pressure 137/66, pulse 71, temperature 99 F (37.2 C), temperature source Axillary, resp. rate (!) 30, height  (1.626 m), weight 131.5 kg, SpO2 94 %.    Vent Mode: PRVC FiO2 (%):  [70 %-90 %] 70 % Set Rate:  [30 bmp] 30 bmp Vt Set:  [330 mL] 330 mL PEEP:  [15 cmH20] 15 cmH20 Plateau Pressure:  [25 cmH20-32 cmH20] 25 cmH20   Intake/Output Summary (Last 24 hours) at 02/19/2019 0919 Last data filed at 02/19/2019 0900 Gross per 24 hour  Intake 1987.17 ml  Output 4875 ml  Net -2887.83 ml  Filed Weights   02/17/19 0347 02/18/19 0500 02/19/19 0600  Weight: 129.7 kg 130 kg 131.5 kg    Examination: General: Morbidly obese female sedated and intubated 70% FiO2 and 15 of PEEP, following commands, prone HEENT: Endotracheal tube secure and in place, orogastric tube in place, edematous face Neuro: Awake and alert moves all extremities follows commands despite sedation  CV: Heart sounds are distant regular rate and rhythm sinus rhythm 66 PULM: Bilateral excursion,Diminished breath sounds throughout FiO2 of 90% with 15 of PEEP generating O2 saturations of 94% P/F Ratio>> 123 ZO:XWRU, non-tender,  bsx4 active, Obese  Extremities: warm/dry, 1+ edema , no mottling noted Skin: no rashes or lesions   Assessment & Plan:   Acute hypoxic respiratory failure ++ COVID-19 vs PE (though bilateral infiltrates on CXR argues against this). Ruled in for ++ COVID 19 4/9>> Heparin discontinued Received Actmera x 1 on 4/9 Proned 4/9 ay 18:30 CXR 4/10>> Persistent Bilateral patchy infiltrates - 2300 cc last 24/ Cumulative - 458 PF ratio pending Got lasix x 2 dose 4/9  CXR with worsening pneumonia Remains on Fentanyl at 400 and Versed at 4  Plan Intubated 02/15/2019 currently is on 70% FiO2 with 15 of PEEP  Initiate 6 cc TV 4/10 @ 0900 ABG in am and prn Aggressive Pulmonary toilet Continue ceftriaxone and Zithromax. Continue Plaquenil and Xanax Monitor cultures  Serial chest x-ray Conservative fluid administration   Cardiovascular Bradycardia resolving Initial 12 Lead with QTc prolongation No pressor needs at present despite diuresis 4/12 Plan Continuous bedside QTc monitoring>> 0.45 12 Lead prn Wean pressors for MAP goal of 65  Renal Creatinine 0.74 Good response to diuresis 4/11 Improving K Hypo Mag Plan Replete mag BMET daily Monitor UO Trend Mag and Phos Use Diamox if needs additional diuresis  ID T Max 99.2 WBC is 4 Plan Trend fever and WBC ABX as above Follow micro Re-culture as is clinically indicated PCT in am to guide duration of ABX therapy Day 5 Rocephin and Azithro If PCT ok 4.13 am, then consider discontinuing   GI Proned 4/9 Plan: Trickle feed tube feeds initiated SUP protonix  Concern for PE - 1 month symptoms of dyspnea along with extremely elevated d-dimer (2158).  Could also be attributed to CoV alone; though D-dimer this high warrants empiric treatment.  Unable to CTA chest due to CoV rule out. With COVID resulting as + on 4/9, lower suspicion of PE Heparin discontinued 4/9 Plan Hold LE doppler studies for now Lovenox for DVT prophylaxis  Trend D dimer , if significant increase consider heparin gtt  Discussion Sedation continues to be an issue. Will try Ketamine to wean versed and fentanyl Permissive hypercapnia If further diuresis, use diamox PCT 4/13 am to help guide  duration of ABX   Best Practice:  Diet: TF's. Pain/Anxiety/Delirium protocol (if indicated): Fentanyl gtt / Midazolam PRN /Ketamine to wean Morphine scheduled.  RASS goal -1. VAP protocol (if indicated): In place. DVT prophylaxis: Lovenox GI prophylaxis: PPI. Glucose control: N/A. Mobility: Bedrest. Code Status: Full. Family Communication: 02/16/2019 we will need update family via telephone today. Disposition: ICU.  Labs   CBC: Recent Labs  Lab 02/16/19 0101  02/17/19 0247 02/17/19 0918 02/18/19 0427 02/18/19 1200 02/19/19 0424 02/19/19 0450  WBC 6.4  --  2.9*  --  3.0*  --  4.0  --   NEUTROABS 4.5  --  1.5*  --  2.5  --  2.8  --   HGB 13.0   < > 11.8*  11.9* 12.0 12.2 12.7 12.9  HCT 42.7   < > 38.9 35.0* 41.1 36.0 43.7 38.0  MCV 96.8  --  96.0  --  99.8  --  99.1  --   PLT 161  --  155  --  177  --  192  --    < > = values in this interval not displayed.   Basic Metabolic Panel: Recent Labs  Lab 02/16/19 0101  02/17/19 0247  02/17/19 1100 02/18/19 0427 02/18/19 1200 02/19/19 0424 02/19/19 0450  NA 141   < > 143   < > 144 144 143 145 141  K 3.0*   < > 2.8*   < > 3.0* 3.6 3.9 3.9 4.0  CL 99  --  99  --  99 100  --  100  --   CO2 31  --  33*  --  32 31  --  33*  --   GLUCOSE 120*  --  98  --  99 101*  --  115*  --   BUN 9  --  9  --  9 11  --  8  --   CREATININE 0.73  --  0.73  --  0.79 0.82  --  0.74  --   CALCIUM 7.1*  --  6.9*  --  7.1* 7.6*  --  7.9*  --   MG 1.7  --  1.7  --   --  1.9  --  1.9  --   PHOS 1.8*  --  2.7  --   --  3.7  --  4.1  --    < > = values in this interval not displayed.   GFR: Estimated Creatinine Clearance: 90.7 mL/min (by C-G formula based on SCr of 0.74 mg/dL). Recent Labs  Lab 02/16/19 0101  02/17/19 0247 02/18/19 0427 02/19/19 0424  WBC 6.4 2.9* 3.0* 4.0   Liver Function Tests: Recent Labs  Lab 02/16/19 0101 02/18/19 0427 02/19/19 0424  AST 25 24 31   ALT 23 19 25   ALKPHOS 108 95 101  BILITOT 0.8 0.7 1.0  PROT 5.5* 5.4* 5.4*  ALBUMIN 2.9* 2.5* 2.7*   No results for input(s): LIPASE, AMYLASE in the last 168 hours. No results for input(s): AMMONIA in the last 168 hours. ABG    Component Value Date/Time   PHART 7.368 02/19/2019 0450   PCO2ART 67.2 (HH) 02/19/2019 0450   PO2ART 99.0 02/19/2019 0450   HCO3 38.6 (H) 02/19/2019 0450   TCO2 41 (H) 02/19/2019 0450   O2SAT 97.0 02/19/2019 0450    Coagulation Profile: Recent Labs  Lab 02/16/19 0101  INR 1.1   Cardiac Enzymes: No results for input(s): CKTOTAL, CKMB, CKMBINDEX, TROPONINI in the last 168 hours. HbA1C: No results found for: HGBA1C CBG: Recent Labs  Lab 02/18/19 1718 02/18/19 1949 02/18/19 2335 02/19/19 0419 02/19/19 0745  GLUCAP 107* 110* 130* 104* 125*     Critical care time: 45 min.    Bevelyn Ngo, AGACNP-BC [facility] Pager [phone]  If no answer page [phone] 02/19/2019, 9:19 AM   ________________________________________________________________________  PCCM Attending:   68 yo FM, AHRF on MV 2/2 ARDS from COVID19   BP 120/60   Pulse 67   Temp 99 F (37.2 C) (Axillary)   Resp (!) 30   Ht 5\' 4"  (1.626 m)   Wt 131.5 kg   SpO2 91%   BMI 49.78 kg/m   Gen: elderly fm, intubated, sedated on vent  Heart: regular, sinus on tele Lungs: bl vented breaths, wave forms reviewed   CXR: reviewed - BL infiltrates - The patient's images have been independently reviewed by me.    A: ARDS AHRF 2/2 COVID PNA  P: Additional diuresis to maintain euvolemia/neg balance  Plan to re-prone this evening and un-prone schedule will be around 10AM [facility]  Complete abx course  Added ketamine d/t high sedation requirements On versed ggt and fent  ggt, attempt to wean Added propofol today as well  If pressors are need would stop propofl first  ARDS protocol  LTVV  Wean fio2 as tolerate I spoke with daughter   This patient is critically ill with multiple organ system failure; which, requires frequent high complexity decision making, assessment, support, evaluation, and titration of therapies. This was completed through the application of advanced monitoring technologies and extensive interpretation of multiple databases. During this encounter critical care time was devoted to patient care services described in this note for 34 minutes.   Josephine IgoBradley L , DO [facility] 02/19/2019 3:36 PM  Personal pager: [phone] If unanswered, please page CCM On-call: #[phone]

## 2019-02-19 NOTE — Progress Notes (Signed)
Flipped patient back to supine with 1RT and 5 RNs in the room.  Will leave on back for 8 hrs and turn back to prone at 100 02/20/2019.  Pt heavily sedated resting comfortable.  Will continue to monitor patient.

## 2019-02-20 ENCOUNTER — Inpatient Hospital Stay (HOSPITAL_COMMUNITY): Payer: Medicare Other

## 2019-02-20 LAB — CULTURE, BLOOD (ROUTINE X 2)
Culture: NO GROWTH
Culture: NO GROWTH
Special Requests: ADEQUATE
Special Requests: ADEQUATE

## 2019-02-20 LAB — POCT I-STAT 7, (LYTES, BLD GAS, ICA,H+H)
Acid-Base Excess: 9 mmol/L — ABNORMAL HIGH (ref 0.0–2.0)
Bicarbonate: 37 mmol/L — ABNORMAL HIGH (ref 20.0–28.0)
Calcium, Ion: 1.14 mmol/L — ABNORMAL LOW (ref 1.15–1.40)
HCT: 39 % (ref 36.0–46.0)
Hemoglobin: 13.3 g/dL (ref 12.0–15.0)
O2 Saturation: 93 %
Patient temperature: 99.1
Potassium: 3.9 mmol/L (ref 3.5–5.1)
Sodium: 143 mmol/L (ref 135–145)
TCO2: 39 mmol/L — ABNORMAL HIGH (ref 22–32)
pCO2 arterial: 69 mmHg (ref 32.0–48.0)
pH, Arterial: 7.339 — ABNORMAL LOW (ref 7.350–7.450)
pO2, Arterial: 75 mmHg — ABNORMAL LOW (ref 83.0–108.0)

## 2019-02-20 LAB — CBC WITH DIFFERENTIAL/PLATELET
Abs Immature Granulocytes: 0.02 10*3/uL (ref 0.00–0.07)
Basophils Absolute: 0 10*3/uL (ref 0.0–0.1)
Basophils Relative: 0 %
Eosinophils Absolute: 0.1 10*3/uL (ref 0.0–0.5)
Eosinophils Relative: 2 %
HCT: 43.3 % (ref 36.0–46.0)
Hemoglobin: 12.7 g/dL (ref 12.0–15.0)
Immature Granulocytes: 0 %
Lymphocytes Relative: 21 %
Lymphs Abs: 1.1 10*3/uL (ref 0.7–4.0)
MCH: 29.1 pg (ref 26.0–34.0)
MCHC: 29.3 g/dL — ABNORMAL LOW (ref 30.0–36.0)
MCV: 99.1 fL (ref 80.0–100.0)
Monocytes Absolute: 0.3 10*3/uL (ref 0.1–1.0)
Monocytes Relative: 6 %
Neutro Abs: 3.7 10*3/uL (ref 1.7–7.7)
Neutrophils Relative %: 71 %
Platelets: 211 10*3/uL (ref 150–400)
RBC: 4.37 MIL/uL (ref 3.87–5.11)
RDW: 14.3 % (ref 11.5–15.5)
WBC: 5.3 10*3/uL (ref 4.0–10.5)
nRBC: 0 % (ref 0.0–0.2)

## 2019-02-20 LAB — BASIC METABOLIC PANEL
Anion gap: 13 (ref 5–15)
BUN: 12 mg/dL (ref 8–23)
CO2: 36 mmol/L — ABNORMAL HIGH (ref 22–32)
Calcium: 8.6 mg/dL — ABNORMAL LOW (ref 8.9–10.3)
Chloride: 96 mmol/L — ABNORMAL LOW (ref 98–111)
Creatinine, Ser: 0.96 mg/dL (ref 0.44–1.00)
GFR calc Af Amer: >60 mL/min (ref >60)
GFR calc non Af Amer: >60 mL/min (ref >60)
Glucose, Bld: 113 mg/dL — ABNORMAL HIGH (ref 70–99)
Potassium: 3.9 mmol/L (ref 3.5–5.1)
Sodium: 145 mmol/L (ref 135–145)

## 2019-02-20 LAB — GLUCOSE, CAPILLARY
Glucose-Capillary: 105 mg/dL — ABNORMAL HIGH (ref 70–99)
Glucose-Capillary: 108 mg/dL — ABNORMAL HIGH (ref 70–99)
Glucose-Capillary: 109 mg/dL — ABNORMAL HIGH (ref 70–99)
Glucose-Capillary: 117 mg/dL — ABNORMAL HIGH (ref 70–99)
Glucose-Capillary: 119 mg/dL — ABNORMAL HIGH (ref 70–99)
Glucose-Capillary: 121 mg/dL — ABNORMAL HIGH (ref 70–99)

## 2019-02-20 LAB — PHOSPHORUS: Phosphorus: 4.6 mg/dL (ref 2.5–4.6)

## 2019-02-20 LAB — PROCALCITONIN: Procalcitonin: <0.1 ng/mL

## 2019-02-20 LAB — C-REACTIVE PROTEIN: CRP: 4.6 mg/dL — ABNORMAL HIGH (ref <1.0)

## 2019-02-20 LAB — NOVEL CORONAVIRUS, NAA (HOSP ORDER, SEND-OUT TO REF LAB; TAT 18-24 HRS): SARS-CoV-2, NAA: DETECTED — AB

## 2019-02-20 LAB — D-DIMER, QUANTITATIVE: D-Dimer, Quant: 4.86 ug/mL-FEU — ABNORMAL HIGH (ref 0.00–0.50)

## 2019-02-20 LAB — MAGNESIUM: Magnesium: 1.9 mg/dL (ref 1.7–2.4)

## 2019-02-20 LAB — LACTATE DEHYDROGENASE: LDH: 246 U/L — ABNORMAL HIGH (ref 98–192)

## 2019-02-20 MED ORDER — CLONAZEPAM 1 MG PO TABS
1.0000 mg | ORAL_TABLET | Freq: Two times a day (BID) | ORAL | Status: DC
  Administered 2019-02-20 – 2019-02-23 (×7): 1 mg via ORAL
  Filled 2019-02-20 (×7): qty 1

## 2019-02-20 MED ORDER — MIDAZOLAM 50MG/50ML (1MG/ML) PREMIX INFUSION: 0.5000 mg/h | INTRAVENOUS | Status: DC

## 2019-02-20 MED ORDER — FUROSEMIDE 10 MG/ML IJ SOLN
40.0000 mg | Freq: Once | INTRAMUSCULAR | Status: AC
  Administered 2019-02-20: 40 mg via INTRAVENOUS
  Filled 2019-02-20: qty 4

## 2019-02-20 MED ORDER — HYDROMORPHONE BOLUS VIA INFUSION
0.2000 mg | INTRAVENOUS | Status: DC | PRN
  Administered 2019-02-20 – 2019-02-22 (×6): 0.2 mg via INTRAVENOUS
  Filled 2019-02-20: qty 1

## 2019-02-20 MED ORDER — SODIUM CHLORIDE 0.9 % IV SOLN
0.5000 mg/h | INTRAVENOUS | Status: DC
  Administered 2019-02-20: 10:00:00 0.5 mg/h via INTRAVENOUS
  Administered 2019-02-22: 1.25 mg/h via INTRAVENOUS
  Filled 2019-02-20 (×2): qty 5

## 2019-02-20 MED ORDER — ACETAZOLAMIDE SODIUM 500 MG IJ SOLR
250.0000 mg | Freq: Once | INTRAMUSCULAR | Status: AC
  Administered 2019-02-20: 250 mg via INTRAVENOUS
  Filled 2019-02-20: qty 250

## 2019-02-20 NOTE — Progress Notes (Signed)
Pt proned at 0230am with 4RN's and 2RT's present. MD on unit.   Pt will be prone for 16 hrs, return supine 02/20/19 @1830 .

## 2019-02-20 NOTE — Progress Notes (Addendum)
NAME:  Monika Chestang, MRN:  [id number], DOB:  1951/04/26, LOS: 5 ADMISSION DATE:  02/15/2019, CONSULTATION DATE:  02/15/19 REFERRING MD:  [facility]  CHIEF COMPLAINT:  SOB   Brief History   Sherry Becker is a 68 y.o. female who was admitted 4/8 after transfer from [facility] with acute hypoxic respiratory failure requiring intubation in the setting of bilateral infiltrates.  COVID positive.      Past Medical History  No definitive history but per med list from [place], suspect combo of depression and mood disorder along with HLD.  Probable OSA.  Significant Hospital Events   4/08 Admit, intubated. 4/09 Prone  4/10 LCB 4/13 Prone 0230   Consults:     Procedures:  ETT 4/8 >>  CVC 4/9 >> A Line 4/9 >>   Significant Diagnostic Tests:  CXR 4/8 >> extensive bilateral pulmonary infiltrates. LE duplex 4/8 >>  Micro Data:  Blood 4/8 >> Sputum 4/8 >> normal flora UC 4/8 >> negative  RVP 4/9 >> negative  COVID 4/9 >> POSITIVE  Quantiferon gold 4/9 >> negative  Antimicrobials:  Azithromycin 4/8 >> 4/14 Ceftriaxone 4/8 >> 4/14 Plaquenil 4/6 >> completed   Interim history/subjective:  RN reports intermittent vent dyssynchrony.  Remains on ketamine, fentanyl, propofol gtt's.  Versed restarted.  Tmax 99.1.  I/O - UOP 3L in last 24 hours / net neg 1L in last 24. Soft normal pressures.    Objective:  Blood pressure (!) 136/58, pulse 70, temperature 99.1 F (37.3 C), temperature source Axillary, resp. rate (!) 30, height  (1.626 m), weight 129.3 kg, SpO2 94 %.    Vent Mode: PRVC FiO2 (%):  [60 %-80 %] 65 % Set Rate:  [30 bmp] 30 bmp Vt Set:  [330 mL] 330 mL PEEP:  [15 cmH20] 15 cmH20 Plateau Pressure:  [27 cmH20] 27 cmH20   Intake/Output Summary (Last 24 hours) at 02/20/2019 4098 Last data filed at 02/20/2019 0800 Gross per 24 hour  Intake 1905.96 ml  Output 2770 ml  Net -864.04 ml   Filed Weights   02/18/19 0500 02/19/19 0600 02/20/19 0356  Weight: 130  kg 131.5 kg 129.3 kg    Examination: General: obese female lying in bed prone, critically ill appearing HEENT: MM pink/moist, ETT (tube kinked upon NP exam, tube moved to side of mouth with resolution of kink / peak pressure alarm on vent) Neuro: sedate CV: s1s2 rrr, no m/r/g PULM: even/non-labored, lungs bilaterally diminished  JX:BJYN, non-tender, bsx4 active  Extremities: warm/dry, 1+ generalized edema  Skin: no rashes or lesions   Assessment & Plan:   Acute Hypoxic Respiratory Failure secondary to SARS-CoV2 -PaO2/FiO2 ratio 115 4/14 -s/p Actmera 4/9 P: ARDS protocol for low Vt ventilation, 6cc/kg Wean PEEP / FiO2 for sats > 90% Follow intermittent CXR Continue prone positioning 16 hours prone / 8 hours supine Stop abx  Repeat lasix 40 mg IV x1 with diamox x1 Transition to dilaudid gtt, continue ketamine, propofol  Wean versed, fentanyl off  Continue PT oxycodone, add klonopin Discontinue fentanyl patch   Hypotension  -suspect sedation related Bradycardia  QTc Prolongation P: ICU monitoring  Follow QTc (now off agents) Neo if needed to support MAP >65  At Risk AKI  Hypokalemia Hypomagnesemia P: Trend BMP / urinary output Replace electrolytes as indicated Avoid nephrotoxic agents, ensure adequate renal perfusion  Morbid Obesity  At Risk Malnutrition P: Trickle TF  PPI for SUP    Best Practice:  Diet: TF's. Pain/Anxiety/Delirium protocol (if indicated):  Morphine scheduled.  RASS goal -3 VAP protocol (if indicated): In place. DVT prophylaxis: Lovenox GI prophylaxis: PPI. Glucose control: N/A. Mobility: Bedrest. Code Status: Full. Family Communication: Daughter updated via phone 4/13 Disposition: ICU.  Labs   CBC: Recent Labs  Lab 02/16/19 0101  02/17/19 0247  02/18/19 0427 02/18/19 1200 02/19/19 0424 02/19/19 0450 02/20/19 0345 02/20/19 0546  WBC 6.4  --  2.9*  --  3.0*  --  4.0  --  5.3  --   NEUTROABS 4.5  --  1.5*  --  2.5  --  2.8   --  3.7  --   HGB 13.0   < > 11.8*   < > 12.0 12.2 12.7 12.9 12.7 13.3  HCT 42.7   < > 38.9   < > 41.1 36.0 43.7 38.0 43.3 39.0  MCV 96.8  --  96.0  --  99.8  --  99.1  --  99.1  --   PLT 161  --  155  --  177  --  192  --  211  --    < > = values in this interval not displayed.   Basic Metabolic Panel: Recent Labs  Lab 02/16/19 0101  02/17/19 0247  02/17/19 1100 02/18/19 0427 02/18/19 1200 02/19/19 0424 02/19/19 0450 02/20/19 0345 02/20/19 0546  NA 141   < > 143   < > 144 144 143 145 141 145 143  K 3.0*   < > 2.8*   < > 3.0* 3.6 3.9 3.9 4.0 3.9 3.9  CL 99  --  99  --  99 100  --  100  --  96*  --   CO2 31  --  33*  --  32 31  --  33*  --  36*  --   GLUCOSE 120*  --  98  --  99 101*  --  115*  --  113*  --   BUN 9  --  9  --  9 11  --  8  --  12  --   CREATININE 0.73  --  0.73  --  0.79 0.82  --  0.74  --  0.96  --   CALCIUM 7.1*  --  6.9*  --  7.1* 7.6*  --  7.9*  --  8.6*  --   MG 1.7  --  1.7  --   --  1.9  --  1.9  --  1.9  --   PHOS 1.8*  --  2.7  --   --  3.7  --  4.1  --  4.6  --    < > = values in this interval not displayed.   GFR: Estimated Creatinine Clearance: 74.8 mL/min (by C-G formula based on SCr of 0.96 mg/dL). Recent Labs  Lab 02/17/19 0247 02/18/19 0427 02/19/19 0424 02/20/19 0345  PROCALCITON  --   --   --  <0.10  WBC 2.9* 3.0* 4.0 5.3   Liver Function Tests: Recent Labs  Lab 02/16/19 0101 02/18/19 0427 02/19/19 0424  AST 25 24 31   ALT 23 19 25   ALKPHOS 108 95 101  BILITOT 0.8 0.7 1.0  PROT 5.5* 5.4* 5.4*  ALBUMIN 2.9* 2.5* 2.7*   No results for input(s): LIPASE, AMYLASE in the last 168 hours. No results for input(s): AMMONIA in the last 168 hours.   ABG    Component Value Date/Time   PHART 7.339 (L) 02/20/2019 0546   PCO2ART 69.0 (HH)  02/20/2019 0546   PO2ART 75.0 (L) 02/20/2019 0546   HCO3 37.0 (H) 02/20/2019 0546   TCO2 39 (H) 02/20/2019 0546   O2SAT 93.0 02/20/2019 0546    Coagulation Profile: Recent Labs  Lab 02/16/19  0101  INR 1.1   Cardiac Enzymes: No results for input(s): CKTOTAL, CKMB, CKMBINDEX, TROPONINI in the last 168 hours. HbA1C: No results found for: HGBA1C CBG: Recent Labs  Lab 02/19/19 1201 02/19/19 1558 02/19/19 1949 02/19/19 2342 02/20/19 0347  GLUCAP 141* 122* 120* 125* 119*     Critical care time:  45 minutes    Canary BrimBrandi Ollis, NP-C [facility] Pgr: [phone] or if no answer [phone] 02/20/2019, 9:39 AM   ____________________________________________________________________________  PCCM Attending:   68 yo FM, AHRF, intubated, on MV, COVID19 ARDS Proned   BP (!) 125/58   Pulse 64   Temp 99.3 F (37.4 C) (Axillary)   Resp (!) 30   Ht 5\' 4"  (1.626 m)   Wt 129.3 kg   SpO2 94%   BMI 48.92 kg/m   Gen: elderly fm, proned, resting in bed Heart: regular, sinus on tele  Lungs: BL vented breaths, wave forms reviewed   Inflammatory markers improving reviewed labs and abg   A:  AHRF 2/2 COVID19 ARDS on MV Proned Septic Shock  Morbid obesity   P: ARDS protocol  Currently proned, planned to un-prone and leave supine  Additional dose of diuresis today   [facility]  This patient is critically ill with multiple organ system failure; which, requires frequent high complexity decision making, assessment, support, evaluation, and titration of therapies. This was completed through the application of advanced monitoring technologies and extensive interpretation of multiple databases. During this encounter critical care time was devoted to patient care services described in this note for 34 minutes.   Josephine IgoBradley L Acheron Sugg, DO [facility] 02/20/2019 5:08 PM  Personal pager: [phone] If unanswered, please page CCM On-call: [phone]

## 2019-02-21 ENCOUNTER — Inpatient Hospital Stay (HOSPITAL_COMMUNITY): Payer: Medicare Other

## 2019-02-21 DIAGNOSIS — J80 Acute respiratory distress syndrome: Secondary | ICD-10-CM

## 2019-02-21 DIAGNOSIS — L8990 Pressure ulcer of unspecified site, unspecified stage: Secondary | ICD-10-CM

## 2019-02-21 LAB — POCT I-STAT 7, (LYTES, BLD GAS, ICA,H+H)
Acid-Base Excess: 7 mmol/L — ABNORMAL HIGH (ref 0.0–2.0)
Acid-Base Excess: 5 mmol/L — ABNORMAL HIGH (ref 0.0–2.0)
Bicarbonate: 34.3 mmol/L — ABNORMAL HIGH (ref 20.0–28.0)
Bicarbonate: 35.2 mmol/L — ABNORMAL HIGH (ref 20.0–28.0)
Calcium, Ion: 1.2 mmol/L (ref 1.15–1.40)
Calcium, Ion: 1.22 mmol/L (ref 1.15–1.40)
HCT: 38 % (ref 36.0–46.0)
HCT: 42 % (ref 36.0–46.0)
Hemoglobin: 12.9 g/dL (ref 12.0–15.0)
Hemoglobin: 14.3 g/dL (ref 12.0–15.0)
O2 Saturation: 95 %
O2 Saturation: 95 %
Patient temperature: 99.3
Potassium: 3.3 mmol/L — ABNORMAL LOW (ref 3.5–5.1)
Potassium: 4.6 mmol/L (ref 3.5–5.1)
Sodium: 142 mmol/L (ref 135–145)
Sodium: 141 mmol/L (ref 135–145)
TCO2: 36 mmol/L — ABNORMAL HIGH (ref 22–32)
TCO2: 38 mmol/L — ABNORMAL HIGH (ref 22–32)
pCO2 arterial: 62.3 mmHg — ABNORMAL HIGH (ref 32.0–48.0)
pCO2 arterial: 82.3 mmHg (ref 32.0–48.0)
pH, Arterial: 7.351 (ref 7.350–7.450)
pH, Arterial: 7.239 — ABNORMAL LOW (ref 7.350–7.450)
pO2, Arterial: 85 mmHg (ref 83.0–108.0)
pO2, Arterial: 92 mmHg (ref 83.0–108.0)

## 2019-02-21 LAB — CBC
HCT: 34.9 % — ABNORMAL LOW (ref 36.0–46.0)
Hemoglobin: 10.2 g/dL — ABNORMAL LOW (ref 12.0–15.0)
MCH: 29.4 pg (ref 26.0–34.0)
MCHC: 29.2 g/dL — ABNORMAL LOW (ref 30.0–36.0)
MCV: 100.6 fL — ABNORMAL HIGH (ref 80.0–100.0)
Platelets: 204 10*3/uL (ref 150–400)
RBC: 3.47 MIL/uL — ABNORMAL LOW (ref 3.87–5.11)
RDW: 14.4 % (ref 11.5–15.5)
WBC: 4.7 10*3/uL (ref 4.0–10.5)
nRBC: 0 % (ref 0.0–0.2)

## 2019-02-21 LAB — BASIC METABOLIC PANEL
Anion gap: 8 (ref 5–15)
BUN: 10 mg/dL (ref 8–23)
CO2: 27 mmol/L (ref 22–32)
Calcium: 6.8 mg/dL — ABNORMAL LOW (ref 8.9–10.3)
Chloride: 110 mmol/L (ref 98–111)
Creatinine, Ser: 0.73 mg/dL (ref 0.44–1.00)
GFR calc Af Amer: >60 mL/min (ref >60)
GFR calc non Af Amer: >60 mL/min (ref >60)
Glucose, Bld: 90 mg/dL (ref 70–99)
Potassium: 2.7 mmol/L — CL (ref 3.5–5.1)
Sodium: 145 mmol/L (ref 135–145)

## 2019-02-21 LAB — GLUCOSE, CAPILLARY
Glucose-Capillary: 151 mg/dL — ABNORMAL HIGH (ref 70–99)
Glucose-Capillary: 69 mg/dL — ABNORMAL LOW (ref 70–99)
Glucose-Capillary: 112 mg/dL — ABNORMAL HIGH (ref 70–99)
Glucose-Capillary: 121 mg/dL — ABNORMAL HIGH (ref 70–99)
Glucose-Capillary: 140 mg/dL — ABNORMAL HIGH (ref 70–99)

## 2019-02-21 LAB — POTASSIUM: Potassium: 4.5 mmol/L (ref 3.5–5.1)

## 2019-02-21 LAB — MAGNESIUM: Magnesium: 2 mg/dL (ref 1.7–2.4)

## 2019-02-21 MED ORDER — CHLORHEXIDINE GLUCONATE CLOTH 2 % EX PADS
6.0000 | MEDICATED_PAD | Freq: Every day | CUTANEOUS | Status: DC
  Administered 2019-02-21 – 2019-03-01 (×9): 6 via TOPICAL

## 2019-02-21 MED ORDER — ROCURONIUM BROMIDE 50 MG/5ML IV SOLN
100.0000 mg | Freq: Once | INTRAVENOUS | Status: AC
  Administered 2019-02-21: 100 mg via INTRAVENOUS
  Filled 2019-02-21: qty 10

## 2019-02-21 MED ORDER — POTASSIUM CHLORIDE 10 MEQ/100ML IV SOLN
10.0000 meq | INTRAVENOUS | Status: AC
  Administered 2019-02-21 (×4): 10 meq via INTRAVENOUS
  Filled 2019-02-21 (×4): qty 100

## 2019-02-21 MED ORDER — DEXTROSE 50 % IV SOLN
INTRAVENOUS | Status: AC
  Administered 2019-02-21: 04:00:00 25 mL
  Filled 2019-02-21: qty 50

## 2019-02-21 MED ORDER — VITAL HIGH PROTEIN PO LIQD
1000.0000 mL | ORAL | Status: AC
  Administered 2019-02-21 – 2019-02-22 (×3): 1000 mL

## 2019-02-21 MED ORDER — IPRATROPIUM-ALBUTEROL 0.5-2.5 (3) MG/3ML IN SOLN
3.0000 mL | Freq: Four times a day (QID) | RESPIRATORY_TRACT | Status: DC
  Administered 2019-02-21 – 2019-02-23 (×7): 3 mL via RESPIRATORY_TRACT
  Filled 2019-02-21 (×7): qty 3

## 2019-02-21 MED ORDER — POTASSIUM CHLORIDE 20 MEQ/15ML (10%) PO SOLN
40.0000 meq | Freq: Once | ORAL | Status: AC
  Administered 2019-02-21: 40 meq
  Filled 2019-02-21: qty 30

## 2019-02-21 MED ORDER — PRO-STAT SUGAR FREE PO LIQD
30.0000 mL | Freq: Three times a day (TID) | ORAL | Status: DC
  Administered 2019-02-21 – 2019-02-23 (×6): 30 mL
  Filled 2019-02-21 (×6): qty 30

## 2019-02-21 MED ORDER — OXYCODONE HCL 5 MG PO TABS
10.0000 mg | ORAL_TABLET | ORAL | Status: DC
  Administered 2019-02-21 – 2019-02-23 (×13): 10 mg via ORAL
  Filled 2019-02-21 (×13): qty 2

## 2019-02-21 MED ORDER — GUAIFENESIN 100 MG/5ML PO SOLN
15.0000 mL | Freq: Four times a day (QID) | ORAL | Status: DC
  Administered 2019-02-21 – 2019-02-23 (×8): 300 mg via ORAL
  Filled 2019-02-21 (×8): qty 15

## 2019-02-21 MED ORDER — FUROSEMIDE 10 MG/ML IJ SOLN
40.0000 mg | Freq: Two times a day (BID) | INTRAMUSCULAR | Status: AC
  Administered 2019-02-21 (×2): 40 mg via INTRAVENOUS
  Filled 2019-02-21 (×2): qty 4

## 2019-02-21 MED ORDER — POTASSIUM CHLORIDE 20 MEQ/15ML (10%) PO SOLN
40.0000 meq | ORAL | Status: AC
  Administered 2019-02-21 (×2): 40 meq via ORAL
  Filled 2019-02-21 (×3): qty 30

## 2019-02-21 MED ORDER — SODIUM CHLORIDE 3 % IN NEBU
4.0000 mL | INHALATION_SOLUTION | Freq: Two times a day (BID) | RESPIRATORY_TRACT | Status: AC
  Administered 2019-02-22: 4 mL via RESPIRATORY_TRACT
  Filled 2019-02-21 (×2): qty 4

## 2019-02-21 NOTE — Progress Notes (Signed)
Pt suctioned orally and via ETT prior to leaving unit. RRT assisted [facility] in moving pt from ICU bed to transport cot. Pt then placed on Transport [place] Vent at 1235. Vital signs stable. Pt tolerated well. Ambu bag sent with pt.

## 2019-02-21 NOTE — Progress Notes (Signed)
[place] [facility] medicine attending, [facility]  We received Sherry Becker today in transfer from my partners who were caring for her at [facility].  She has been quite ill with COVID pneumonia, ARDS.  Has required prone positioning over the last several days.  She remains quite hypoxemic and has thick mucus in her airways requiring suctioning.  On exam today she is well sedated, on Dilaudid, propofol, and a ketamine infusion O2 saturation remains low around 88%, lungs are clear with poor air movement, plateau pressures on the ventilator elevated While I was in the room with her she developed a mucous plug and near complete respiratory arrest.  Respiratory therapy was able to suction this with lavage and clear her and the plateau pressures improved.  ARDS: Repeat ABG in 1 hour, continue full ARDS ventilator protocol, goal PaO2 55-65, if P to F ratio less than 150 then will need to have pruning again today, we went ahead and gave 1 dose of rocuronium today  Need for sedation for mechanical ventilation: Stop propofol, continue Dilaudid and ketamine  Thick airway secretions: Commonly seen in COVID-19, start hypertonic saline and DuoNeb overnight, reassess in the morning  Additional critical care time today by me was 40 minutes  Heber [place], MD [place] [facility] Pager: [phone] Cell: [phone] If no response, call [phone]

## 2019-02-21 NOTE — Progress Notes (Signed)
eLink Physician-Brief Progress Note Patient Name: Sherry Becker DOB: 18-Jan-1951 MRN: [id number]   Date of Service  02/21/2019  HPI/Events of Note  K low 2.7, Cr normal.   eICU Interventions  Kcl 10 meq q1 hr x 4 doses and Kcl 40 meq via NG tube. Get Mag level. Follow K level at 11 AM.      Intervention Category Intermediate Interventions: Electrolyte abnormality - evaluation and management  Ranee Gosselin 02/21/2019, 5:22 AM

## 2019-02-21 NOTE — Progress Notes (Signed)
Hypoglycemic Event  CBG: 69  Treatment: 12.5mg  D50 given  Symptoms: None  Follow-up CBG: Time:0357 CBG Result:151  Possible Reasons for Event: Unknown    Sherry Becker

## 2019-02-21 NOTE — Progress Notes (Signed)
CRITICAL VALUE ALERT  Critical Value:  K+ 2.7  Date & Time Notied:  02/21/19 at 0412    Provider Notified: Elink  Orders Received/Actions taken: Will await replacement orders

## 2019-02-21 NOTE — Progress Notes (Addendum)
30cc of versed gtt wasted appropriately with Katelind RN

## 2019-02-21 NOTE — Progress Notes (Signed)
Pt arrived to [facility] from [facility] with biteblock in. Pt with copious oral secretions so bite block removed to allow for better suctioning. Multiple abrasions noted on tongue. RN aware.

## 2019-02-21 NOTE — Progress Notes (Addendum)
Nutrition Follow-up  DOCUMENTATION CODES:   Morbid obesity  INTERVENTION:    Increase Vital High Protein to 40 ml/h  Pro-stat 30 ml TID  Provides 1260 kcal (1983 kcal total with lipids from Propofol), 129 gm protein, 803 ml free water daily  NUTRITION DIAGNOSIS:   Inadequate oral intake related to inability to eat as evidenced by NPO status.  Ongoing  GOAL:   Provide needs based on ASPEN/SCCM guidelines  Progressing  MONITOR:   Vent status, TF tolerance, Labs, Skin, I & O's  ASSESSMENT:   68 yo female with PMH of HLD and depression who was transferred from [facility] on 4/8 with acute respiratory failure requiring intubation. COVID-19 testing came back positive. Patient transferred to [facility] 4/14.  S/P proning on 4/9 and 4/13.  Remains on ARDS protocol.  Patient is currently intubated on ventilator support Temp (24hrs), Avg:98.9 F (37.2 C), Min:98.6 F (37 C), Max:99.3 F (37.4 C)  Propofol: 27.4 ml/hr providing 723 kcal from lipid.   Labs reviewed. CBG's: 151-112-121 Medications reviewed and include Lasix, KCl, propofol.   Currently receiving trickle TF with Vital High Protein at 10 ml/h providing 240 kcal, 21 gm protein, 201 ml free water daily.  NUTRITION - FOCUSED PHYSICAL EXAM:  unable to complete  Diet Order:   Diet Order            Diet NPO time specified  Diet effective now              EDUCATION NEEDS:   No education needs have been identified at this time  Skin:  Skin Assessment: Skin Integrity Issues: Skin Integrity Issues:: Stage II Stage II: nose  Last BM:  4/14 (type 7)  Height:   Ht Readings from Last 1 Encounters:  02/17/19 5\' 4"  (1.626 m)    Weight:   Wt Readings from Last 1 Encounters:  02/21/19 129.3 kg    Ideal Body Weight:  54.5 kg  BMI:  Body mass index is 48.92 kg/m.  Estimated Nutritional Needs:   Kcal:  1450-1850  Protein:  136 gm  Fluid:  2 L    Joaquin Courts, RD, LDN, CNSC Pager  [phone] After Hours Pager [phone]

## 2019-02-21 NOTE — Progress Notes (Addendum)
PROGRESS NOTE                                                                                                                                                                                                             Patient Demographics:    Becci Batty, is a 68 y.o. female, DOB - February 12, 1951, LRJ:[id number]  Outpatient Primary MD for the patient is Patient, No Pcp Per    LOS - 6  No chief complaint on file.      Brief Narrative: Patient is a 68 y.o. female with PMHx of depression, probable OSA-who presented from [facility] as a transfer on 4/8 with acute hypoxic respiratory failure secondary to COVID 19 viral pneumonia.   Significant events: 4/08 Admit, intubated. 4/9 Actemra 4/09 Prone  4/10 LCB 4/13 Prone 0230. Remains on ketamine, fentanyl, propofol gtt's >transition to dilaudid gtt 4/14 transfer to [facility]   Subjective:    Nyala Kirchner today is intubated and sedated-unable to obtain any history.   Assessment  & Plan :   Acute hypoxic respiratory failure secondary to ARDS due to COVID-19 viral pneumonia: Continue full ventilator support-maximal sedation with Dilaudid, ketamine and propofol-underwent prone protocol yesterday-PCCM following.  Patient is s/p Actemra on 4/9-and completed Plaquenil.  Blood cultures/Respiratory virus panel negative.  Hypokalemia: Replete and recheck  Hypotension: Resolved-to be related to sedation.  Bradycardia: Resolved-probably related to sedation.  QTC prolongation: Recheck EKG tomorrow morning-no longer on Plaquenil  Depression/anxiety: Continue Klonopin-Cymbalta and Seroquel on hold  Morbid obesity  Vent Settings: Vent Mode: PRVC FiO2 (%):  [60 %] 60 % Set Rate:  [30 bmp-33 bmp] 33 bmp Vt Set:  [330 mL] 330 mL PEEP:  [12 cmH20-15 cmH20] 12 cmH20 Plateau Pressure:  [21 cmH20-28 cmH20] 21 cmH20  ABG:    Component Value Date/Time   PHART 7.351 02/21/2019  0411   PCO2ART 62.3 (H) 02/21/2019 0411   PO2ART 85.0 02/21/2019 0411   HCO3 34.3 (H) 02/21/2019 0411   TCO2 36 (H) 02/21/2019 0411   O2SAT 95.0 02/21/2019 0411    Condition - Extremely Guarded  Family Communication  :  Daughter updated over the phone by PCCM earlier this morning.  Code Status :  DNR  Diet : NPO-NG feeds in place  Disposition Plan  :  Remain in the ICU  Consults  :  PCCM  Procedures  :    ETT 4/8>> CVC/left IJ 4/9>>  GI prophylaxis: PPI  DVT Prophylaxis  :  Lovenox  Lab Results  Component Value Date   PLT 204 02/21/2019    Inpatient Medications  Scheduled Meds:  chlorhexidine gluconate (MEDLINE KIT)  15 mL Mouth Rinse BID   Chlorhexidine Gluconate Cloth  6 each Topical Daily   clonazePAM  1 mg Oral BID   enoxaparin (LOVENOX) injection  40 mg Subcutaneous Q24H   feeding supplement (VITAL HIGH PROTEIN)  1,000 mL Per Tube Q24H   furosemide  40 mg Intravenous BID   mouth rinse  15 mL Mouth Rinse 10 times per day   oxyCODONE  10 mg Oral Q4H   pantoprazole sodium  40 mg Per Tube Q24H   polyethylene glycol  17 g Oral Daily   potassium chloride  40 mEq Oral Q4H   Continuous Infusions:  sodium chloride 10 mL/hr at 02/21/19 0500   HYDROmorphone 0.5 mg/hr (02/21/19 1046)   ketamine infusion 500 mg in sodium chloride 0.9% 100 mL 0.94 mg/kg/hr (02/21/19 1019)   propofol (DIPRIVAN) infusion 35 mcg/kg/min (02/21/19 1145)   PRN Meds:.Place/Maintain arterial line **AND** sodium chloride, bisacodyl, HYDROmorphone  Antibiotics  :    Anti-infectives (From admission, onward)   Start     Dose/Rate Route Frequency Ordered Stop   02/16/19 2200  hydroxychloroquine (PLAQUENIL) tablet 200 mg     200 mg Per Tube 2 times daily 02/15/19 2051 02/20/19 0908   02/15/19 2200  hydroxychloroquine (PLAQUENIL) tablet 400 mg     400 mg Per Tube 2 times daily 02/15/19 2051 02/16/19 2159   02/15/19 2130  azithromycin (ZITHROMAX) 500 mg in sodium chloride 0.9 %  250 mL IVPB  Status:  Discontinued     500 mg 250 mL/hr over 60 Minutes Intravenous Daily at bedtime 02/15/19 2051 02/20/19 0923   02/15/19 2130  cefTRIAXone (ROCEPHIN) 2 g in sodium chloride 0.9 % 100 mL IVPB  Status:  Discontinued     2 g 200 mL/hr over 30 Minutes Intravenous Daily at bedtime 02/15/19 2057 02/20/19 0923   02/15/19 2100  cefTRIAXone (ROCEPHIN) injection 1 g  Status:  Discontinued     1 g Intramuscular Every 24 hours 02/15/19 2051 02/15/19 2057       Time Spent in minutes  35 min  Oren Binet M.D on 02/21/2019 at 2:18 PM  To page go to [email or web address] - password [place]  [facility] -  Office  [phone]  See all Orders from today for further details   Admit date - 02/15/2019      Objective:   Vitals:   02/21/19 0800 02/21/19 1200 02/21/19 1245 02/21/19 1319  BP: 123/66 (!) 148/72  (!) 184/81  Pulse: 61 68  83  Resp: (!) 30 (!) 30  (!) 33  Temp: 98.8 F (37.1 C) 98.8 F (37.1 C)    TempSrc: Axillary Axillary    SpO2: 93% 93% 93% 93%  Weight:      Height:        Wt Readings from Last 3 Encounters:  02/21/19 129.3 kg     Intake/Output Summary (Last 24 hours) at 02/21/2019 1418 Last data filed at 02/21/2019 1200 Gross per 24 hour  Intake 2089.48 ml  Output 2530 ml  Net -440.52 ml     Physical Exam Gen Exam:Intubated, Sedated. HEENT:atraumatic, normocephalic Chest: B/L clear to auscultation anteriorly CVS:S1S2 regular Abdomen:soft non tender, non distended Extremities:no edema Neurology: difficult to examine given sedation/intubation Skin:  no rash   Data Review:    CBC Recent Labs  Lab 02/16/19 0101  02/17/19 0247  02/18/19 0427  02/19/19 0424 02/19/19 0450 02/20/19 0345 02/20/19 0546 02/21/19 0339 02/21/19 0411  WBC 6.4  --  2.9*  --  3.0*  --  4.0  --  5.3  --  4.7  --   HGB 13.0   < > 11.8*   < > 12.0   < > 12.7 12.9 12.7 13.3 10.2* 12.9  HCT 42.7   < > 38.9   < > 41.1   < > 43.7 38.0 43.3 39.0 34.9* 38.0  PLT 161   --  155  --  177  --  192  --  211  --  204  --   MCV 96.8  --  96.0  --  99.8  --  99.1  --  99.1  --  100.6*  --   MCH 29.5  --  29.1  --  29.1  --  28.8  --  29.1  --  29.4  --   MCHC 30.4  --  30.3  --  29.2*  --  29.1*  --  29.3*  --  29.2*  --   RDW 14.6  --  14.7  --  15.0  --  14.5  --  14.3  --  14.4  --   LYMPHSABS 1.5  --  1.2  --  0.4*  --  0.9  --  1.1  --   --   --   MONOABS 0.4  --  0.2  --  0.1  --  0.3  --  0.3  --   --   --   EOSABS 0.0  --  0.0  --  0.0  --  0.0  --  0.1  --   --   --   BASOSABS 0.0  --  0.0  --  0.0  --  0.0  --  0.0  --   --   --    < > = values in this interval not displayed.    Chemistries  Recent Labs  Lab 02/16/19 0101  02/17/19 0247  02/17/19 1100 02/18/19 0427  02/19/19 0424 02/19/19 0450 02/20/19 0345 02/20/19 0546 02/21/19 0339 02/21/19 0411 02/21/19 0530 02/21/19 1035  NA 141   < > 143   < > 144 144   < > 145 141 145 143 145 142  --   --   K 3.0*   < > 2.8*   < > 3.0* 3.6   < > 3.9 4.0 3.9 3.9 2.7* 3.3*  --  4.5  CL 99  --  99  --  99 100  --  100  --  96*  --  110  --   --   --   CO2 31  --  33*  --  32 31  --  33*  --  36*  --  27  --   --   --   GLUCOSE 120*  --  98  --  99 101*  --  115*  --  113*  --  90  --   --   --   BUN 9  --  9  --  9 11  --  8  --  12  --  10  --   --   --   CREATININE 0.73  --  0.73  --  0.79 0.82  --  0.74  --  0.96  --  0.73  --   --   --   CALCIUM 7.1*  --  6.9*  --  7.1* 7.6*  --  7.9*  --  8.6*  --  6.8*  --   --   --   MG 1.7  --  1.7  --   --  1.9  --  1.9  --  1.9  --   --   --  2.0  --   AST 25  --   --   --   --  24  --  31  --   --   --   --   --   --   --   ALT 23  --   --   --   --  19  --  25  --   --   --   --   --   --   --   ALKPHOS 108  --   --   --   --  95  --  101  --   --   --   --   --   --   --   BILITOT 0.8  --   --   --   --  0.7  --  1.0  --   --   --   --   --   --   --    < > = values in this interval not displayed.    ------------------------------------------------------------------------------------------------------------------ Recent Labs    02/19/19 1614  TRIG 308*    No results found for: HGBA1C ------------------------------------------------------------------------------------------------------------------ No results for input(s): TSH, T4TOTAL, T3FREE, THYROIDAB in the last 72 hours.  Invalid input(s): FREET3 ------------------------------------------------------------------------------------------------------------------ Recent Labs    02/19/19 0424  FERRITIN 211    Coagulation profile Recent Labs  Lab 02/16/19 0101  INR 1.1    Recent Labs    02/19/19 0424 02/20/19 0345  DDIMER 5.88* 4.86*    Cardiac Enzymes No results for input(s): CKMB, TROPONINI, MYOGLOBIN in the last 168 hours.  Invalid input(s): CK ------------------------------------------------------------------------------------------------------------------ No results found for: BNP  Micro Results Recent Results (from the past 240 hour(s))  MRSA PCR Screening     Status: None   Collection Time: 02/15/19  8:56 PM  Result Value Ref Range Status   MRSA by PCR NEGATIVE NEGATIVE Final    Comment:        The GeneXpert MRSA Assay (FDA approved for NASAL specimens only), is one component of a comprehensive MRSA colonization surveillance program. It is not intended to diagnose MRSA infection nor to guide or monitor treatment for MRSA infections. Performed at [facility], [place] [address] [place], [place] [address]   Culture, blood (routine x 2)     Status: None   Collection Time: 02/15/19  9:46 PM  Result Value Ref Range Status   Specimen Description BLOOD LEFT FOREARM  Final   Special Requests   Final    BOTTLES DRAWN AEROBIC ONLY Blood Culture adequate volume   Culture   Final    NO GROWTH 5 DAYS Performed at [facility], [place] [address], [place], [place] [address]    Report Status  02/20/2019 FINAL  Final  Urine culture     Status: None   Collection Time: 02/15/19  9:55 PM  Result Value Ref Range Status   Specimen Description URINE, RANDOM  Final   Special Requests NONE  Final  Culture   Final    NO GROWTH Performed at [facility], [place] [address] [place], [place] [address]    Report Status 02/16/2019 FINAL  Final  Culture, blood (routine x 2)     Status: None   Collection Time: 02/15/19  9:56 PM  Result Value Ref Range Status   Specimen Description BLOOD LEFT HAND  Final   Special Requests   Final    BOTTLES DRAWN AEROBIC AND ANAEROBIC Blood Culture adequate volume   Culture   Final    NO GROWTH 5 DAYS Performed at [facility], [place] [address] [place], [place] [address]    Report Status 02/20/2019 FINAL  Final  Culture, respiratory (tracheal aspirate)     Status: None   Collection Time: 02/15/19 11:51 PM  Result Value Ref Range Status   Specimen Description TRACHEAL ASPIRATE  Final   Special Requests NONE  Final   Gram Stain   Final    MODERATE WBC PRESENT, PREDOMINANTLY MONONUCLEAR FEW GRAM POSITIVE RODS FEW GRAM POSITIVE COCCI    Culture   Final    RARE Consistent with normal respiratory flora. Performed at [facility], [place] [address], [place], [place] [address]    Report Status 02/18/2019 FINAL  Final  Respiratory Panel by PCR     Status: None   Collection Time: 02/16/19  4:02 AM  Result Value Ref Range Status   Adenovirus NOT DETECTED NOT DETECTED Final   Coronavirus 229E NOT DETECTED NOT DETECTED Final    Comment: (NOTE) The Coronavirus on the Respiratory Panel, DOES NOT test for the novel  Coronavirus (2019 nCoV)    Coronavirus HKU1 NOT DETECTED NOT DETECTED Final   Coronavirus NL63 NOT DETECTED NOT DETECTED Final   Coronavirus OC43 NOT DETECTED NOT DETECTED Final   Metapneumovirus NOT DETECTED NOT DETECTED Final   Rhinovirus / Enterovirus NOT DETECTED NOT DETECTED Final   Influenza A NOT DETECTED NOT DETECTED  Final   Influenza B NOT DETECTED NOT DETECTED Final   Parainfluenza Virus 1 NOT DETECTED NOT DETECTED Final   Parainfluenza Virus 2 NOT DETECTED NOT DETECTED Final   Parainfluenza Virus 3 NOT DETECTED NOT DETECTED Final   Parainfluenza Virus 4 NOT DETECTED NOT DETECTED Final   Respiratory Syncytial Virus NOT DETECTED NOT DETECTED Final   Bordetella pertussis NOT DETECTED NOT DETECTED Final   Chlamydophila pneumoniae NOT DETECTED NOT DETECTED Final   Mycoplasma pneumoniae NOT DETECTED NOT DETECTED Final    Comment: Performed at [facility], [address], [place], [place] [address]  Novel Coronavirus, NAA (hospital order; send-out to ref lab)     Status: Abnormal   Collection Time: 02/16/19  2:49 PM  Result Value Ref Range Status   SARS-CoV-2, NAA DETECTED (A) NOT DETECTED Final    Comment: Positive (Detected) results are indicative of active infection with SARS CoV 2. A positive result does not rule out bacterial infection or coinfection with other viruses. Detection of SARS CoV 2 viral RNA may not indicate that SARS CoV 2 is the causative agent for clinical symptoms. Positive and negative predictive values of testing are highly dependent on prevalence. False positive test results are more likely when prevalence is moderate to low. RESULT CALLED TO, READ BACK BY AND VERIFIED WITH: NBeckie Salts 0121 02/20/2019 T. TYSOR (NOTE) The expected result is Negative (Not Detected). The SARS CoV 2 test is intended for the presumptive qualitative  detection of nucleic acid from SARS CoV 2  in upper and lower  respiratory specimens. Testing methodology is real time RT PCR. Test results must be correlated with clinical presentation and  evaluated in the context of other laboratory and epidemiologic data.  Test performance can be affected because the epidemiology and  cli nical spectrum of infection caused by SARS CoV 2 is not fully  known. For example, the optimum types of specimens to  collect and  when during the course of infection these specimens are most likely  to contain detectable viral RNA may not be known. This test has not been Food and Drug Administration (FDA) cleared or  approved and has been authorized by FDA under an Emergency Use  Authorization (EUA). The test is only authorized for the duration of  the declaration that circumstances exist justifying the authorization  of emergency use of in vitro diagnostic tests for detection and or  diagnosis of SARS CoV 2 under Section 564(b)(1) of the Act, 21 U.S.C.  section [phone] 3(b)(1), unless the authorization is terminated or  revoked sooner. [facility] is certified under the  Clinical Laboratory Improvement Amendments of 1988 (CLIA), 42 U.S.C.  section [phone], to perform high complexity tests. Performed at [place] [id number], [place], [place] [address] Laboratory Director: Loleta Books, MD Fact Sheet for Healthcare Providers  BankingDealers.co.za Fact Sheet for Patients  StrictlyIdeas.no    Coronavirus Source NASOPHARYNGEAL  Final    Comment: Performed at [facility], [address], [place], [place] [address]    Radiology Reports Dg Chest 1 View  Result Date: 02/20/2019 CLINICAL DATA:  Evaluate endotracheal tube EXAM: CHEST  1 VIEW COMPARISON:  02/20/2019, 02/19/2019 FINDINGS: Endotracheal tube with the tip 5.3 cm above the carina. Nasogastric tube coursing below the diaphragm. Left jugular central venous catheter with the tip projecting over the SVC. Bilateral peripheral interstitial and alveolar airspace opacities improved compared from x-ray performed earlier same day. No pleural effusion or pneumothorax. Stable cardiomegaly. No acute osseous abnormality. IMPRESSION: 1. Support lines and tubing in satisfactory position. 2. Bilateral interstitial and alveolar airspace  opacities with improved aeration compared with the most recent prior examination performed earlier same day. Electronically Signed   By: Kathreen Devoid   On: 02/20/2019 19:32   Dg Chest Port 1 View  Result Date: 02/20/2019 CLINICAL DATA:  Respiratory failure EXAM: PORTABLE CHEST 1 VIEW COMPARISON:  02/19/2019 FINDINGS: Endotracheal tube, NG tube, left jugular central venous catheter are stable. Diffuse bilateral airspace opacities are stable. Normal heart size. No pneumothorax. IMPRESSION: Stable support apparatus and diffuse bilateral airspace disease. Electronically Signed   By: Marybelle Killings M.D.   On: 02/20/2019 07:26   Dg Chest Port 1 View  Result Date: 02/19/2019 CLINICAL DATA:  Respiratory failure.  COVID-19 EXAM: PORTABLE CHEST 1 VIEW COMPARISON:  02/18/2019 FINDINGS: Prone positioning. Endotracheal tube in good position. Left jugular catheter tip in the SVC. NG in the stomach. Cardiac enlargement. Diffuse bilateral airspace disease unchanged. No pneumothorax or effusion. IMPRESSION: Diffuse bilateral airspace disease unchanged. Support lines in good position and unchanged. Electronically Signed   By: Franchot Gallo M.D.   On: 02/19/2019 08:08   Dg Chest Port 1 View  Result Date: 02/18/2019 CLINICAL DATA:  Ventilator dependent respiratory failure. Suspected COVID-19 infection. EXAM: PORTABLE CHEST 1 VIEW COMPARISON:  02/17/2019 and earlier. FINDINGS: Endotracheal tube tip in satisfactory position approximately 6 cm above the carina. RIGHT-sided central venous catheter crosses the midline with its  tip likely in the LEFT innominate vein. Gastric tube difficult to visualize distally due to radiographic technique but likely courses below the diaphragm. Worsening airspace consolidation throughout the RIGHT lung and in the LEFT UPPER LOBE. Stable airspace consolidation in the LEFT LOWER LOBE. Stable cardiomegaly. Pulmonary venous hypertension without overt edema, unchanged. IMPRESSION: 1. Support  apparatus satisfactory. 2. Worsening pneumonia throughout the RIGHT lung and in the LEFT UPPER LOBE. Stable LEFT LOWER LOBE pneumonia. 3. Stable cardiomegaly. Pulmonary venous hypertension without overt edema. Electronically Signed   By: Evangeline Dakin M.D.   On: 02/18/2019 08:03   Dg Chest Port 1 View  Result Date: 02/17/2019 CLINICAL DATA:  Intubation EXAM: PORTABLE CHEST 1 VIEW COMPARISON:  02/17/2019 FINDINGS: endotracheal tube is 5 cm above the carina. NG tube enters the stomach. Cardiomegaly, vascular congestion. Bilateral interstitial prominence and lower lobe airspace opacities, likely edema. Small effusions suspected. No acute bony abnormality. IMPRESSION: Endotracheal tube 5 cm above the carina. Cardiomegaly with vascular congestion, interstitial prominence and lower lobe opacities, likely edema. Small layering effusions. Electronically Signed   By: Rolm Baptise M.D.   On: 02/17/2019 16:55   Dg Chest Port 1 View  Result Date: 02/17/2019 CLINICAL DATA:  Respiratory failure. Coronavirus positive patient. The image was taken prone. EXAM: PORTABLE CHEST 1 VIEW COMPARISON:  Single-view of the chest earlier today and 02/16/2019. FINDINGS: Support tubes and lines are unchanged. Bilateral airspace disease persists without change. Heart size is appears enlarged. No pneumothorax or pleural effusion. IMPRESSION: No change in support apparatus or left worse than right airspace disease since the examination earlier today. Electronically Signed   By: Inge Rise M.D.   On: 02/17/2019 11:46   Dg Chest Port 1 View  Result Date: 02/17/2019 CLINICAL DATA:  Respiratory failure. Possible COVID-19. patient in prone position. EXAM: PORTABLE CHEST 1 VIEW COMPARISON:  02/16/2019 FINDINGS: Endotracheal tube tip is 5 cm above the carina. Orogastric or nasogastric tube enters the abdomen. Left internal jugular central line tip in the SVC at the azygos level. Bilateral patchy pulmonary infiltrates persist, similar  allowing for technical differences. IMPRESSION: Persistent bilateral patchy pulmonary infiltrates. Lines and tubes satisfactory. Today's study is done prone. Electronically Signed   By: Nelson Chimes M.D.   On: 02/17/2019 06:20   Dg Chest Port 1 View  Result Date: 02/16/2019 CLINICAL DATA:  Central line placement. EXAM: PORTABLE CHEST 1 VIEW COMPARISON:  Radiograph earlier this day at 0530 hour FINDINGS: Tip of the left internal jugular central venous catheter projects over the brachiocephalic/SVC confluence. No pneumothorax. Endotracheal tube tip 6.3 cm from the carina and the thoracic inlet. Enteric tube in place, not well visualized distal to the lower esophagus. Again seen cardiomegaly. No significant change in mixed interstitial and airspace opacities, side for equivocal worsening at the left lung base. IMPRESSION: 1. Tip of the left internal jugular central venous catheter projects over the brachiocephalic/SVC confluence. No pneumothorax. 2. Unchanged cardiomegaly. Bilateral mixed interstitial and airspace opacities grossly stable, with equivocal worsening at the left lung base. Electronically Signed   By: Keith Rake M.D.   On: 02/16/2019 19:12   Dg Chest Port 1 View  Result Date: 02/16/2019 CLINICAL DATA:  68 year old female with respiratory failure EXAM: PORTABLE CHEST 1 VIEW COMPARISON:  02/15/2019 FINDINGS: Cardiomediastinal silhouette unchanged in size and contour. Cardiomegaly. Endotracheal tube terminates 5.6 cm above the carina. Gastric tube terminates out of the field of view. Improving aeration of the bilateral lungs, with persisting mixed interstitial and airspace opacities and thickening  of the minor fissure. No pneumothorax. No large pleural effusion. IMPRESSION: Persisting mixed interstitial and airspace opacities, with slight improved aeration from the prior plain film. Unchanged endotracheal tube and gastric tube Electronically Signed   By: Corrie Mckusick D.O.   On: 02/16/2019 08:09     Dg Chest Port 1 View  Result Date: 02/15/2019 CLINICAL DATA:  Intubation. EXAM: PORTABLE CHEST 1 VIEW COMPARISON:  Radiograph earlier this day at [place]: Endotracheal tube tip 5.1 cm from the carina. Enteric tube in place, tip likely below the diaphragm but not well visualized distal to the lower thorax. Progression in diffuse bilateral interstitial and patchy airspace disease from exam earlier this day. Slight cardiomegaly compared to prior. Hazy opacity at the left lung base may be developing effusion or soft tissue attenuation. No pneumothorax. IMPRESSION: 1. Endotracheal tube tip 5.1 cm from the carina. Enteric tube in place, tip likely below the diaphragm but not well visualized in the lower thorax. 2. Progression in diffuse bilateral interstitial and patchy airspace disease from exam earlier this day, favoring multifocal pneumonia over pulmonary edema. Slight enlargement of the cardiac silhouette and possible developing left pleural effusion are new. Electronically Signed   By: Keith Rake M.D.   On: 02/15/2019 21:51

## 2019-02-21 NOTE — Progress Notes (Signed)
[facility] PCCM  ABG reviewed, pCO2 has climbed to around 90; pO2 around 90 P:F ratio still < 150,  Increase TVol to 7cc Prone positioning again overnight  Heber Moscow, MD [place] PCCM Pager: [phone] Cell: [phone] If no response, call [phone]

## 2019-02-21 NOTE — Care Management (Signed)
Case manager will monitor patient for disposition plan as she medically improves. May God Bless Her.    Vance Peper, RN Case Manager  [phone]

## 2019-02-21 NOTE — Progress Notes (Signed)
   02/21/19 1300  Clinical Encounter Type  Visited With Family  Visit Type Initial;Psychological support;Spiritual support  Referral From Nurse  Consult/Referral To Chaplain  Spiritual Encounters  Spiritual Needs Emotional;Other (Comment) (Spiritual Care Conversation/Support)  Stress Factors  Family Stress Factors Other (Comment) (Left a Voicemail )   I called the patient's daughter to provide spiritual care support. She did not answer the phone, so I left a voicemail message.   Please, contact Spiritual Care for further assistance.   Chaplain Clint Bolder M.Div., [facility]

## 2019-02-21 NOTE — Progress Notes (Addendum)
NAME:  Sherry KeasCatherine Angelini, MRN:  [id number], DOB:  07/14/1951, LOS: 6 ADMISSION DATE:  02/15/2019, CONSULTATION DATE:  02/15/19 REFERRING MD:  [facility]  CHIEF COMPLAINT:  SOB   Brief History   Sherry Becker is a 68 y.o. female who was admitted 4/8 [facility] with acute hypoxic respiratory failure requiring intubation in the setting of bilateral infiltrates.  COVID positive.      Past Medical History  No definitive history but per med list from [facility], suspect combo of depression and mood disorder along with HLD.  Probable OSA.  Significant Hospital Events   4/08 Admit, intubated. 4/09 Prone  4/10 LCB 4/13 Prone 0230. Remains on ketamine, fentanyl, propofol gtt's >transition to dilaudid gtt  Consults:     Procedures:  ETT 4/8 >>  CVC 4/9 >> L Rad A Line 4/9 >>   Significant Diagnostic Tests:  CXR 4/8 >> extensive bilateral pulmonary infiltrates. LE duplex 4/8 >> not done due to airborne precautions >>  Micro Data:  Blood 4/8 >> negative Sputum 4/8 >> normal flora UC 4/8 >> negative  RVP 4/9 >> negative  COVID 4/9 >> POSITIVE  Quantiferon gold 4/9 >> negative  Antimicrobials:  Azithromycin 4/8 >> 4/14 Ceftriaxone 4/8 >> 4/14 Plaquenil 4/6 >> completed   Interim history/subjective:  Afebrile.  I/O - UOP 2.5L in 24/hr, net neg 310 in 24h (1.8L neg for admit).  On 0.5mg  dilaudid, 35 propofol, ketamine 0.94.     Objective:  Blood pressure 123/66, pulse 61, temperature 98.8 F (37.1 C), temperature source Axillary, resp. rate (!) 30, height 5\' 4"  (1.626 m), weight 129.3 kg, SpO2 93 %.    Vent Mode: PRVC FiO2 (%):  [60 %] 60 % Set Rate:  [30 bmp] 30 bmp Vt Set:  [330 mL] 330 mL PEEP:  [15 cmH20] 15 cmH20 Plateau Pressure:  [22 cmH20-28 cmH20] 28 cmH20   Intake/Output Summary (Last 24 hours) at 02/21/2019 [id number] Last data filed at 02/21/2019 0800 Gross per 24 hour  Intake 2463.44 ml  Output 2700 ml  Net -236.56 ml   Filed Weights   02/19/19 0600 02/20/19 0356 02/21/19 0500  Weight: 131.5 kg 129.3 kg 129.3 kg    Examination: General: adult female lying in bed on vent, critically ill appearing  HEENT: MM pink/moist, ETT, oral airway in place, small abrasion on left lateral tongue  Neuro: sedate CV: s1s2 rrr, SR 60's on monitor, no m/r/g PULM: even/non-labored, lungs bilaterally distant / difficult to auscultate due to ambient noise JX:BJYNGI:soft, non-tender, bsx4 active  Extremities: warm/dry, generalized 1-2+ edema  Skin: no rashes or lesions  Assessment & Plan:   Acute Hypoxic Respiratory Failure secondary to SARS-CoV2 -PaO2/FiO2 ratio 115 4/14 > 142 4/14 -s/p Actmera 4/9 P: ARDS protocol for low Vt ventilation, 6cc/kg, rate 30 Wean PEEP / FiO2 for sats >90% Reduced PEEP to 12 4/14 Follow intermittent ABG / PCXR  [facility] 4/14  Monitor off abx  Lasix 40 mg BID x2 doses with KCL Sedation more stable on dilaudid, ketamine, propofol > reduced ceilings of IV sedation 4/14 Continue PT klonopin, oxycodone  Begin weaning sedation as able   Hypotension  -suspect sedation related Bradycardia  QTc Prolongation P: ICU monitoring  Neosynephrine gtt for MAP >65   At Risk AKI  Hypokalemia Hypomagnesemia P: Trend BMP / urinary output Replace electrolytes as indicated Avoid nephrotoxic agents, ensure adequate renal perfusion  Morbid Obesity  At Risk Malnutrition P: Continue trickle TF  PPI for SUP    Best Practice:  Diet: TF's. Pain/Anxiety/Delirium protocol (if indicated): In place,  RASS goal -3 VAP protocol (if indicated): In place. DVT prophylaxis: Lovenox GI prophylaxis: PPI. Glucose control: N/A. Mobility: Bedrest. Code Status: Full. Family Communication: Daughter updated on patients status, plan of care 4/14. Daughter aware that patient is to transfer to [facility].  Disposition: ICU.  Labs   CBC: Recent Labs  Lab 02/16/19 0101  02/17/19 0247  02/18/19 0427   02/19/19 0424 02/19/19 0450 02/20/19 0345 02/20/19 0546 02/21/19 0339 02/21/19 0411  WBC 6.4  --  2.9*  --  3.0*  --  4.0  --  5.3  --  4.7  --   NEUTROABS 4.5  --  1.5*  --  2.5  --  2.8  --  3.7  --   --   --   HGB 13.0   < > 11.8*   < > 12.0   < > 12.7 12.9 12.7 13.3 10.2* 12.9  HCT 42.7   < > 38.9   < > 41.1   < > 43.7 38.0 43.3 39.0 34.9* 38.0  MCV 96.8  --  96.0  --  99.8  --  99.1  --  99.1  --  100.6*  --   PLT 161  --  155  --  177  --  192  --  211  --  204  --    < > = values in this interval not displayed.   Basic Metabolic Panel: Recent Labs  Lab 02/16/19 0101  02/17/19 0247  02/17/19 1100 02/18/19 0427  02/19/19 0424 02/19/19 0450 02/20/19 0345 02/20/19 0546 02/21/19 0339 02/21/19 0411 02/21/19 0530  NA 141   < > 143   < > 144 144   < > 145 141 145 143 145 142  --   K 3.0*   < > 2.8*   < > 3.0* 3.6   < > 3.9 4.0 3.9 3.9 2.7* 3.3*  --   CL 99  --  99  --  99 100  --  100  --  96*  --  110  --   --   CO2 31  --  33*  --  32 31  --  33*  --  36*  --  27  --   --   GLUCOSE 120*  --  98  --  99 101*  --  115*  --  113*  --  90  --   --   BUN 9  --  9  --  9 11  --  8  --  12  --  10  --   --   CREATININE 0.73  --  0.73  --  0.79 0.82  --  0.74  --  0.96  --  0.73  --   --   CALCIUM 7.1*  --  6.9*  --  7.1* 7.6*  --  7.9*  --  8.6*  --  6.8*  --   --   MG 1.7  --  1.7  --   --  1.9  --  1.9  --  1.9  --   --   --  2.0  PHOS 1.8*  --  2.7  --   --  3.7  --  4.1  --  4.6  --   --   --   --    < > = values in  this interval not displayed.   GFR: Estimated Creatinine Clearance: 89.8 mL/min (by C-G formula based on SCr of 0.73 mg/dL). Recent Labs  Lab 02/18/19 0427 02/19/19 0424 02/20/19 0345 02/21/19 0339  PROCALCITON  --   --  <0.10  --   WBC 3.0* 4.0 5.3 4.7   Liver Function Tests: Recent Labs  Lab 02/16/19 0101 02/18/19 0427 02/19/19 0424  AST ALT ALKPHOS 108 95 101  BILITOT 0.8 0.7 1.0  PROT 5.5* 5.4* 5.4*  ALBUMIN 2.9* 2.5*  2.7*   No results for input(s): LIPASE, AMYLASE in the last 168 hours. No results for input(s): AMMONIA in the last 168 hours.   ABG    Component Value Date/Time   PHART 7.351 02/21/2019 0411   PCO2ART 62.3 (H) 02/21/2019 0411   PO2ART 85.0 02/21/2019 0411   HCO3 34.3 (H) 02/21/2019 0411   TCO2 36 (H) 02/21/2019 0411   O2SAT 95.0 02/21/2019 0411    Coagulation Profile: Recent Labs  Lab 02/16/19 0101  INR 1.1   Cardiac Enzymes: No results for input(s): CKTOTAL, CKMB, CKMBINDEX, TROPONINI in the last 168 hours. HbA1C: No results found for: HGBA1C CBG: Recent Labs  Lab 02/20/19 1951 02/20/19 2354 02/21/19 0337 02/21/19 0357 02/21/19 0802  GLUCAP 105* 109* 69* 151* 112*     Critical care time:  35 minutes    Canary Brim, NP-C [facility] Pgr: [phone] or if no answer [phone] 02/21/2019, 8:38 AM    ____________________________________________________________________________  PCCM Attending:   68 yo FM, AHRF, COVID19 ARDS, proned for 3 cycles, now supine, oxygenation improving   BP 123/66 (BP Location: Right Leg)   Pulse 61   Temp 98.8 F (37.1 C) (Axillary)   Resp (!) 30   Ht  (1.626 m)   Wt 129.3 kg   SpO2 93%   BMI 48.92 kg/m   Gen: elderly fm, intubated, sedated on vent  Heart: sinus on tele, regular  Lungs: bl vented breaths, wave forms reviewed   ABG    Component Value Date/Time   PHART 7.351 02/21/2019 0411   PCO2ART 62.3 (H) 02/21/2019 0411   PO2ART 85.0 02/21/2019 0411   HCO3 34.3 (H) 02/21/2019 0411   TCO2 36 (H) 02/21/2019 0411   O2SAT 95.0 02/21/2019 0411    Intake/Output Summary (Last 24 hours) at 02/21/2019 1227 Last data filed at 02/21/2019 1100 Gross per 24 hour  Intake 2168.76 ml  Output 1855 ml  Net 313.76 ml     A: AHRF on MV, 2/2 ARDS, BL Viral COVID19 PNA Septic shock, resolved  Morbid obesity   P: ARDS protcol Weaning Fio2 as tolerated Dropped PEEP again today  Plans to leave supine  at this point  Additional diuresis today, lasix 40 BID X2 doses  Sedation remains issue, orals added, continue on dilaudid infusion  Continue ketamine, appears to tolerate well  Discussed with pharmacy regarding sedation needs  This patient is critically ill with multiple organ system failure; which, requires frequent high complexity decision making, assessment, support, evaluation, and titration of therapies. This was completed through the application of advanced monitoring technologies and extensive interpretation of multiple databases. During this encounter critical care time was devoted to patient care services described in this note for 36 minutes.   Josephine Igo, DO [facility] 02/21/2019 12:30 PM  Personal pager: [phone] If unanswered, please page CCM On-call: #[phone]

## 2019-02-22 ENCOUNTER — Inpatient Hospital Stay (HOSPITAL_COMMUNITY): Payer: Medicare Other

## 2019-02-22 LAB — POCT I-STAT 7, (LYTES, BLD GAS, ICA,H+H)
Acid-Base Excess: 8 mmol/L — ABNORMAL HIGH (ref 0.0–2.0)
Bicarbonate: 35.7 mmol/L — ABNORMAL HIGH (ref 20.0–28.0)
Calcium, Ion: 1.22 mmol/L (ref 1.15–1.40)
HCT: 40 % (ref 36.0–46.0)
Hemoglobin: 13.6 g/dL (ref 12.0–15.0)
O2 Saturation: 95 %
Patient temperature: 100.6
Potassium: 4.4 mmol/L (ref 3.5–5.1)
Sodium: 143 mmol/L (ref 135–145)
TCO2: 38 mmol/L — ABNORMAL HIGH (ref 22–32)
pCO2 arterial: 69 mmHg (ref 32.0–48.0)
pH, Arterial: 7.328 — ABNORMAL LOW (ref 7.350–7.450)
pO2, Arterial: 90 mmHg (ref 83.0–108.0)

## 2019-02-22 LAB — GLUCOSE, CAPILLARY
Glucose-Capillary: 94 mg/dL (ref 70–99)
Glucose-Capillary: 118 mg/dL — ABNORMAL HIGH (ref 70–99)
Glucose-Capillary: 140 mg/dL — ABNORMAL HIGH (ref 70–99)
Glucose-Capillary: 141 mg/dL — ABNORMAL HIGH (ref 70–99)
Glucose-Capillary: 156 mg/dL — ABNORMAL HIGH (ref 70–99)
Glucose-Capillary: 158 mg/dL — ABNORMAL HIGH (ref 70–99)
Glucose-Capillary: 159 mg/dL — ABNORMAL HIGH (ref 70–99)

## 2019-02-22 LAB — BASIC METABOLIC PANEL
Anion gap: 11 (ref 5–15)
BUN: 19 mg/dL (ref 8–23)
CO2: 33 mmol/L — ABNORMAL HIGH (ref 22–32)
Calcium: 8.7 mg/dL — ABNORMAL LOW (ref 8.9–10.3)
Chloride: 100 mmol/L (ref 98–111)
Creatinine, Ser: 0.77 mg/dL (ref 0.44–1.00)
GFR calc Af Amer: >60 mL/min (ref >60)
GFR calc non Af Amer: >60 mL/min (ref >60)
Glucose, Bld: 118 mg/dL — ABNORMAL HIGH (ref 70–99)
Potassium: 3.4 mmol/L — ABNORMAL LOW (ref 3.5–5.1)
Sodium: 144 mmol/L (ref 135–145)

## 2019-02-22 LAB — MAGNESIUM: Magnesium: 2.3 mg/dL (ref 1.7–2.4)

## 2019-02-22 LAB — TRIGLYCERIDES: Triglycerides: 330 mg/dL — ABNORMAL HIGH (ref <150)

## 2019-02-22 MED ORDER — POTASSIUM CHLORIDE 20 MEQ/15ML (10%) PO SOLN
40.0000 meq | Freq: Two times a day (BID) | ORAL | Status: AC
  Administered 2019-02-22 (×2): 40 meq via ORAL
  Filled 2019-02-22 (×2): qty 30

## 2019-02-22 MED ORDER — MIDAZOLAM HCL 2 MG/2ML IJ SOLN
1.0000 mg | INTRAMUSCULAR | Status: DC | PRN
  Administered 2019-02-23: 2 mg via INTRAVENOUS
  Filled 2019-02-22: qty 2

## 2019-02-22 MED ORDER — FUROSEMIDE 10 MG/ML IJ SOLN
40.0000 mg | Freq: Once | INTRAMUSCULAR | Status: AC
  Administered 2019-02-23: 40 mg via INTRAVENOUS
  Filled 2019-02-22: qty 4

## 2019-02-22 MED ORDER — HYDRALAZINE HCL 20 MG/ML IJ SOLN
10.0000 mg | INTRAMUSCULAR | Status: DC | PRN
  Administered 2019-02-22 – 2019-02-23 (×2): 10 mg via INTRAVENOUS
  Filled 2019-02-22 (×3): qty 1

## 2019-02-22 MED ORDER — ENOXAPARIN SODIUM 60 MG/0.6ML ~~LOC~~ SOLN
60.0000 mg | SUBCUTANEOUS | Status: DC
  Administered 2019-02-22 – 2019-02-28 (×7): 60 mg via SUBCUTANEOUS
  Filled 2019-02-22 (×8): qty 0.6

## 2019-02-22 NOTE — Progress Notes (Signed)
NAME:  Sherry Becker, MRN:  [id number], DOB:  [birth year], LOS: 7 ADMISSION DATE:  02/15/2019, CONSULTATION DATE:  02/15/19 REFERRING MD:  [facility]  CHIEF COMPLAINT:  SOB   Brief History   Sherry Becker is a 68 y.o. female who was admitted 4/8 after transfer from [facility] with acute hypoxic respiratory failure requiring intubation in the setting of bilateral infiltrates.  COVID positive.      Past Medical History  No definitive history but per med list from [facility], suspect combo of depression and mood disorder along with HLD.  Probable OSA.  Significant Hospital Events   4/08 Admit, intubated. 4/09 Prone  4/10 LCB 4/13 Prone 0230. Remains on ketamine, fentanyl, propofol gtt's >transition to dilaudid gtt  Consults:     Procedures:  ETT 4/8 >>  CVC 4/9 >> L Rad A Line 4/9 >>   Significant Diagnostic Tests:  CXR 4/8 >> extensive bilateral pulmonary infiltrates. LE duplex 4/8 >> not done due to airborne precautions >>  Micro Data:  Blood 4/8 >> negative Sputum 4/8 >> normal flora UC 4/8 >> negative  RVP 4/9 >> negative  COVID 4/9 >> POSITIVE  Quantiferon gold 4/9 >> negative  Antimicrobials:  Azithromycin 4/8 >> 4/14 Ceftriaxone 4/8 >> 4/14 Plaquenil 4/6 >> completed   Interim history/subjective:  Afebrile.  I/O - UOP 2.5L in 24/hr, net neg 310 in 24h (1.8L neg for admit).  On 0.5mg  dilaudid, 35 propofol, ketamine 0.94.     Objective:  Blood pressure (!) 153/76, pulse 86, temperature (!) 100.6 F (38.1 C), temperature source Axillary, resp. rate (!) 30, height  (1.626 m), weight 129.3 kg, SpO2 96 %.    Vent Mode: PRVC FiO2 (%):  [60 %-80 %] 60 % Set Rate:  [30 bmp-33 bmp] 30 bmp Vt Set:  [330 mL-400 mL] 400 mL PEEP:  [12 cmH20-15 cmH20] 12 cmH20 Plateau Pressure:  [21 cmH20-24 cmH20] 23 cmH20   Intake/Output Summary (Last 24 hours) at 02/22/2019 1032 Last data filed at 02/22/2019 1478 Gross per 24 hour  Intake 1408.66 ml  Output 3475 ml   Net -2066.34 ml   Filed Weights   02/19/19 0600 02/20/19 0356 02/21/19 0500  Weight: 131.5 kg 129.3 kg 129.3 kg    Examination: General: adult female lying in bed on vent, critically ill appearing  HEENT: MM pink/moist, ETT, oral airway in place, small abrasion on left lateral tongue  Neuro: sedate CV: s1s2 rrr, SR 60's on monitor, no m/r/g PULM: even/non-labored, lungs bilaterally distant / difficult to auscultate due to ambient noise GN:FAOZ, non-tender, bsx4 active  Extremities: warm/dry, generalized 1-2+ edema  Skin: no rashes or lesions  Assessment & Plan:   Acute Hypoxic Respiratory Failure secondary to SARS-CoV2 -PaO2/FiO2 ratio 115 4/14 > 142 4/14 -s/p Actmera 4/9 P: Continue ARDS protocol, goal SaO2 greater than 88% Resume supine position today, repeat ABG, if PaO2 to FiO2 ratio less than 150 can go back to prone later today Continue close monitoring of skin breakdown while in prone position Continue diuretics as ordered Continue sedation with Klonopin, oxycodone, stop propofol, continue Dilaudid ketamine for now    Best Practice:  Diet: TF's. Pain/Anxiety/Delirium protocol (if indicated): In place,  RASS goal -3 VAP protocol (if indicated): In place. DVT prophylaxis: Lovenox GI prophylaxis: PPI. Glucose control: N/A. Mobility: Bedrest. Code Status: Full. Family Communication: per TRH.  Disposition: ICU.  Labs   CBC: Recent Labs  Lab 02/16/19 0101  02/17/19 0247  02/18/19 0427  02/19/19 0424  02/20/19 0345 02/20/19 0546 02/21/19 0339 02/21/19 0411 02/21/19 1554  WBC 6.4  --  2.9*  --  3.0*  --  4.0  --  5.3  --  4.7  --   --   NEUTROABS 4.5  --  1.5*  --  2.5  --  2.8  --  3.7  --   --   --   --   HGB 13.0   < > 11.8*   < > 12.0   < > 12.7   < > 12.7 13.3 10.2* 12.9 14.3  HCT 42.7   < > 38.9   < > 41.1   < > 43.7   < > 43.3 39.0 34.9* 38.0 42.0  MCV 96.8  --  96.0  --  99.8  --  99.1  --  99.1  --  100.6*  --   --   PLT 161  --  155  --  177  --   192  --  211  --  204  --   --    < > = values in this interval not displayed.   Basic Metabolic Panel: Recent Labs  Lab 02/16/19 0101  02/17/19 0247  02/18/19 0427  02/19/19 0424  02/20/19 0345 02/20/19 0546 02/21/19 0339 02/21/19 0411 02/21/19 0530 02/21/19 1035 02/21/19 1554 02/22/19 0500  NA 141   < > 143   < > 144   < > 145   < > 145 143 145 142  --   --  141 144  K 3.0*   < > 2.8*   < > 3.6   < > 3.9   < > 3.9 3.9 2.7* 3.3*  --  4.5 4.6 3.4*  CL 99  --  99   < > 100  --  100  --  96*  --  110  --   --   --   --  100  CO2 31  --  33*   < > 31  --  33*  --  36*  --  27  --   --   --   --  33*  GLUCOSE 120*  --  98   < > 101*  --  115*  --  113*  --  90  --   --   --   --  118*  BUN 9  --  9   < > 11  --  8  --  12  --  10  --   --   --   --  19  CREATININE 0.73  --  0.73   < > 0.82  --  0.74  --  0.96  --  0.73  --   --   --   --  0.77  CALCIUM 7.1*  --  6.9*   < > 7.6*  --  7.9*  --  8.6*  --  6.8*  --   --   --   --  8.7*  MG 1.7  --  1.7  --  1.9  --  1.9  --  1.9  --   --   --  2.0  --   --  2.3  PHOS 1.8*  --  2.7  --  3.7  --  4.1  --  4.6  --   --   --   --   --   --   --    < > =  values in this interval not displayed.   GFR: Estimated Creatinine Clearance: 89.8 mL/min (by C-G formula based on SCr of 0.77 mg/dL). Recent Labs  Lab 02/18/19 0427 02/19/19 0424 02/20/19 0345 02/21/19 0339  PROCALCITON  --   --  <0.10  --   WBC 3.0* 4.0 5.3 4.7   Liver Function Tests: Recent Labs  Lab 02/16/19 0101 02/18/19 0427 02/19/19 0424  AST 25 24 31   ALT 23 19 25   ALKPHOS 108 95 101  BILITOT 0.8 0.7 1.0  PROT 5.5* 5.4* 5.4*  ALBUMIN 2.9* 2.5* 2.7*   No results for input(s): LIPASE, AMYLASE in the last 168 hours. No results for input(s): AMMONIA in the last 168 hours.   ABG    Component Value Date/Time   PHART 7.239 (L) 02/21/2019 1554   PCO2ART 82.3 (HH) 02/21/2019 1554   PO2ART 92.0 02/21/2019 1554   HCO3 35.2 (H) 02/21/2019 1554   TCO2 38 (H) 02/21/2019  1554   O2SAT 95.0 02/21/2019 1554    Coagulation Profile: Recent Labs  Lab 02/16/19 0101  INR 1.1   Cardiac Enzymes: No results for input(s): CKTOTAL, CKMB, CKMBINDEX, TROPONINI in the last 168 hours. HbA1C: No results found for: HGBA1C CBG: Recent Labs  Lab 02/21/19 1113 02/21/19 1719 02/21/19 1951 02/22/19 0534 02/22/19 0933  GLUCAP 121* 94 140* 118* 140*     Critical care time:  31 minutes      Heber [place], MD [place] [facility] Pager: [phone] Cell: [phone] If no response, call [phone]

## 2019-02-22 NOTE — Progress Notes (Signed)
   02/22/19 1300  Clinical Encounter Type  Visited With Family  Visit Type Follow-up;Psychological support;Spiritual support  Referral From Family  Consult/Referral To Chaplain  Spiritual Encounters  Spiritual Needs Other (Comment);Literature (Spiritual care conversation/support )  Stress Factors  Family Stress Factors Health changes   Spoke with the patient's daughter and provided spiritual support.  I will follow up again.   Chaplain Clint Bolder M.Div., [facility]

## 2019-02-22 NOTE — Progress Notes (Signed)
[facility] PCCM  Comfortable with ketamine off  Add prn versed  D/c ketamine  Continue dilaudid  Heber [place], MD [facility] Pager: [phone] Cell: [phone] If no response, call [phone]

## 2019-02-22 NOTE — Progress Notes (Signed)
°                                  PROGRESS NOTE                                             °                                                                                                                     °                                         ° ° Patient Demographics:  ° ° Sherry Becker, is a 68 y.o. female, DOB - 07/23/1951, MRN:[id number] ° °Outpatient Primary MD for the patient is Patient, No Pcp Per    LOS - 7 ° °No chief complaint on file. °    ° °Brief Narrative: °Patient is a 68 y.o. female with PMHx of depression, probable OSA-who presented from [facility] as a transfer on 4/8 with acute hypoxic respiratory failure secondary to COVID 19 viral pneumonia.  ° °Significant events: °4/08 Admit, intubated. °4/9 Actemra °4/09 Prone  °4/10 LCB °4/13 Prone 0230. Remains on ketamine, fentanyl, propofol gtt's >transition to dilaudid gtt °4/14 Transfer to [facility] °4/14  Mucus plugging/prone ° ° Subjective:  ° ° Sherry Becker today is intubated and sedated-unable to obtain any history. ° ° Assessment  & Plan :  ° °Acute hypoxic respiratory failure secondary to ARDS due to COVID-19 viral pneumonia: Continue full ventilator support-underwent proning yesterday. Patient is s/p Actemra on 4/9-and completed Plaquenil.  Blood cultures/Respiratory virus panel negative.  PCCM following. ° °Hypokalemia: Continue to replete-recheck tomorrow. ° °Hypotension: Resolved-possibly related to sedative agents. ° °Bradycardia: Resolved-thought to be related to sedative agents ° °Depression/anxiety: Continue Klonopin-Cymbalta and Seroquel on hold-we will resume when sedation needs are less than current ° °Morbid obesity ° °Vent Settings: °Vent Mode: PRVC °FiO2 (%):  [60 %-80 %] 60 % °Set Rate:  [30 bmp-33 bmp] 30 bmp °Vt Set:  [330 mL-400 mL] 400 mL °PEEP:  [12 cmH20-15 cmH20] 12 cmH20 °Plateau Pressure:  [21 cmH20-24 cmH20] 23 cmH20 ° °ABG: °   °Component Value Date/Time  ° PHART 7.239 (L)  02/21/2019 1554  ° PCO2ART 82.3 (HH) 02/21/2019 1554  ° PO2ART 92.0 02/21/2019 1554  ° HCO3 35.2 (H) 02/21/2019 1554  ° TCO2 38 (H) 02/21/2019 1554  ° O2SAT 95.0 02/21/2019 1554  ° ° °Condition - Extremely Guarded ° °Family Communication  :  Daughter updated over the phone on 4/15 ° °Code Status : Partial code ° °Diet : NPO-NG feeds in place ° °Disposition Plan  :  Remain in the ICU ° °Consults  :  PCCM ° °  Procedures  :   ° °ETT 4/8>> °CVC/left IJ 4/9>> ° °GI prophylaxis: PPI ° °DVT Prophylaxis  :  Lovenox ° °Lab Results  °Component Value Date  ° PLT 204 02/21/2019  ° ° °Inpatient Medications ° °Scheduled Meds: °• chlorhexidine gluconate (MEDLINE KIT)  15 mL Mouth Rinse BID  °• Chlorhexidine Gluconate Cloth  6 each Topical Daily  °• clonazePAM  1 mg Oral BID  °• enoxaparin (LOVENOX) injection  40 mg Subcutaneous Q24H  °• feeding supplement (PRO-STAT SUGAR FREE 64)  30 mL Per Tube TID  °• feeding supplement (VITAL HIGH PROTEIN)  1,000 mL Per Tube Q24H  °• guaiFENesin  15 mL Oral Q6H  °• ipratropium-albuterol  3 mL Nebulization Q6H  °• mouth rinse  15 mL Mouth Rinse 10 times per day  °• oxyCODONE  10 mg Oral Q4H  °• pantoprazole sodium  40 mg Per Tube Q24H  °• polyethylene glycol  17 g Oral Daily  °• potassium chloride  40 mEq Oral BID  °• sodium chloride HYPERTONIC  4 mL Nebulization BID  ° °Continuous Infusions: °• sodium chloride 10 mL/hr at 02/21/19 1641  °• HYDROmorphone 1 mg/hr (02/22/19 0628)  °• ketamine infusion 500 mg in sodium chloride 0.9% 100 mL 0.94 mg/kg/hr (02/22/19 0626)  °• propofol (DIPRIVAN) infusion 2.525 mcg/kg/min (02/22/19 0709)  ° °PRN Meds:.Place/Maintain arterial line **AND** sodium chloride, bisacodyl, HYDROmorphone ° °Antibiotics  :   ° °Anti-infectives (From admission, onward)  ° Start     Dose/Rate Route Frequency Ordered Stop  ° 02/16/19 2200  hydroxychloroquine (PLAQUENIL) tablet 200 mg    ° 200 mg Per Tube 2 times daily 02/15/19 2051 02/20/19 0908  ° 02/15/19 2200  hydroxychloroquine  (PLAQUENIL) tablet 400 mg    ° 400 mg Per Tube 2 times daily 02/15/19 2051 02/16/19 2159  ° 02/15/19 2130  azithromycin (ZITHROMAX) 500 mg in sodium chloride 0.9 % 250 mL IVPB  Status:  Discontinued    ° 500 mg °250 mL/hr over 60 Minutes Intravenous Daily at bedtime 02/15/19 2051 02/20/19 0923  ° 02/15/19 2130  cefTRIAXone (ROCEPHIN) 2 g in sodium chloride 0.9 % 100 mL IVPB  Status:  Discontinued    ° 2 g °200 mL/hr over 30 Minutes Intravenous Daily at bedtime 02/15/19 2057 02/20/19 0923  ° 02/15/19 2100  cefTRIAXone (ROCEPHIN) injection 1 g  Status:  Discontinued    ° 1 g Intramuscular Every 24 hours 02/15/19 2051 02/15/19 2057  °  ° ° ° Time Spent in minutes  35 min ° °  M.D on 02/22/2019 at 8:38 AM ° °To page go to [email or web address] - password [email or web address] ° °[facility] -  Office  [phone] ° °See all Orders from today for further details ° ° °Admit date - 02/15/2019   ° ° ° Objective:  ° °Vitals:  ° 02/22/19 0600 02/22/19 0700 02/22/19 0744 02/22/19 0800  °BP: (!) 182/81 (!) 165/82 (!) 176/76   °Pulse: 93 (!) 105 (!) 105   °Resp: (!) 30 (!) 30 (!) 30   °Temp:      °TempSrc:      °SpO2: 97% 97% 96% 96%  °Weight:      °Height:      ° ° °Wt Readings from Last 3 Encounters:  °02/21/19 129.3 kg  ° ° ° °Intake/Output Summary (Last 24 hours) at 02/22/2019 0838 °Last data filed at 02/22/2019 0400 °Gross per 24 hour  °Intake 1223.81 ml  °Output 3385 ml  °Net -2161.19 ml  ° ° ° °  Intake/Output Summary (Last 24 hours) at 02/22/2019 0838 Last data filed at 02/22/2019 0400 Gross per 24 hour  Intake 1223.81 ml  Output 3385 ml  Net -2161.19 ml     Physical Exam Gen Exam:Intubated-undesignated-awakens with verbal stimuli HEENT:atraumatic, normocephalic Chest: B/L clear to auscultation anteriorly CVS:S1S2 regular Abdomen:soft non tender, non distended Extremities:no edema Neurology: difficult to examine given sedation/intubation Skin: no rash   Data Review:    CBC Recent Labs  Lab 02/16/19 0101  02/17/19 0247  02/18/19 0427  02/19/19 0424  02/20/19 0345 02/20/19 0546 02/21/19 0339 02/21/19 0411 02/21/19 1554  WBC 6.4  --  2.9*  --  3.0*  --  4.0  --  5.3  --   4.7  --   --   HGB 13.0   < > 11.8*   < > 12.0   < > 12.7   < > 12.7 13.3 10.2* 12.9 14.3  HCT 42.7   < > 38.9   < > 41.1   < > 43.7   < > 43.3 39.0 34.9* 38.0 42.0  PLT 161  --  155  --  177  --  192  --  211  --  204  --   --   MCV 96.8  --  96.0  --  99.8  --  99.1  --  99.1  --  100.6*  --   --   MCH 29.5  --  29.1  --  29.1  --  28.8  --  29.1  --  29.4  --   --   MCHC 30.4  --  30.3  --  29.2*  --  29.1*  --  29.3*  --  29.2*  --   --   RDW 14.6  --  14.7  --  15.0  --  14.5  --  14.3  --  14.4  --   --   LYMPHSABS 1.5  --  1.2  --  0.4*  --  0.9  --  1.1  --   --   --   --   MONOABS 0.4  --  0.2  --  0.1  --  0.3  --  0.3  --   --   --   --   EOSABS 0.0  --  0.0  --  0.0  --  0.0  --  0.1  --   --   --   --   BASOSABS 0.0  --  0.0  --  0.0  --  0.0  --  0.0  --   --   --   --    < > = values in this interval not displayed.    Chemistries  Recent Labs  Lab 02/16/19 0101  02/18/19 0427  02/19/19 0424  02/20/19 0345 02/20/19 0546 02/21/19 0339 02/21/19 0411 02/21/19 0530 02/21/19 1035 02/21/19 1554 02/22/19 0500  NA 141   < > 144   < > 145   < > 145 143 145 142  --   --  141 144  K 3.0*   < > 3.6   < > 3.9   < > 3.9 3.9 2.7* 3.3*  --  4.5 4.6 3.4*  CL 99   < > 100  --  100  --  96*  --  110  --   --   --   --  100  CO2 31   < > 31  --  33*  --  113*  --  90  --   --   --   --  118*  °BUN 9   < > 11  --  8  --  12  --  10  --   --   --   --  19  °CREATININE 0.73   < > 0.82  --  0.74  --  0.96  --  0.73  --   --   --   --  0.77  °CALCIUM 7.1*   < > 7.6*  --  7.9*  --  8.6*  --  6.8*  --   --   --   --  8.7*  °MG 1.7   < > 1.9  --  1.9  --  1.9  --   --   --  2.0  --   --  2.3  °AST 25  --  24  --  31  --   --   --   --   --   --   --   --   --   °ALT 23  --  19  --  25  --   --   --   --   --   --   --   --   --   °ALKPHOS 108  --  95  --  101  --   --   --   --   --   --   --   --   --   °BILITOT 0.8  --   0.7  --  1.0  --   --   --   --   --   --   --   --   --   ° < > = values in this interval not displayed.  ° °------------------------------------------------------------------------------------------------------------------ °Recent Labs  °  02/19/19 °1614  °TRIG 308*  ° ° °No results found for: HGBA1C °------------------------------------------------------------------------------------------------------------------ °No results for input(s): TSH, T4TOTAL, T3FREE, THYROIDAB in the last 72 hours. ° °Invalid input(s): FREET3 °------------------------------------------------------------------------------------------------------------------ °No results for input(s): VITAMINB12, FOLATE, FERRITIN, TIBC, IRON, RETICCTPCT in the last 72 hours. ° °Coagulation profile °Recent Labs  °Lab 02/16/19 °0101  °INR 1.1  ° ° °Recent Labs  °  02/20/19 °0345  °DDIMER 4.86*  ° ° °Cardiac Enzymes °No results for input(s): CKMB, TROPONINI, MYOGLOBIN in the last 168 hours. ° °Invalid input(s): CK °------------------------------------------------------------------------------------------------------------------ °No results found for: BNP ° °Micro Results °Recent Results (from the past 240 hour(s))  °MRSA PCR Screening     Status: None  ° Collection Time: 02/15/19  8:56 PM  °Result Value Ref Range Status  ° MRSA by PCR NEGATIVE NEGATIVE Final  °  Comment:        °The GeneXpert MRSA Assay (FDA °approved for NASAL specimens °only), is one component of a °comprehensive MRSA colonization °surveillance program. It is not °intended to diagnose MRSA °infection nor to guide or °monitor treatment for °MRSA infections. °Performed at [facility], [address] [place], [place] [address] °  °Culture, blood (routine x 2)     Status: None  ° Collection Time: 02/15/19  9:46 PM  °Result Value Ref Range Status  ° Specimen Description BLOOD LEFT FOREARM  Final  ° Special Requests   Final  °  BOTTLES DRAWN AEROBIC ONLY Blood Culture adequate volume  °  Culture   Final  °  NO GROWTH 5 DAYS °Performed at  Hospital Lab, [address] [place], [place] [address] °  ° Report Status 02/20/2019 FINAL  Final  °Urine culture     Status: None  ° Collection Time: 02/15/19  9:55 PM  °Result Value Ref Range Status  ° Specimen Description URINE, RANDOM  Final  ° Special Requests NONE  Final  ° Culture   Final  °  NO GROWTH °Performed at [facility], [address] [place], [place] [address] °  ° Report Status 02/16/2019 FINAL  Final  °Culture, blood (routine x 2)     Status: None  ° Collection Time: 02/15/19  9:56 PM  °Result Value Ref Range Status  ° Specimen Description BLOOD LEFT HAND  Final  ° Special Requests   Final  °  BOTTLES DRAWN AEROBIC AND ANAEROBIC Blood Culture adequate volume  ° Culture   Final  °  NO GROWTH 5 DAYS °Performed at [facility], [address] [place], [place] [address] °  ° Report Status 02/20/2019 FINAL  Final  °Culture, respiratory (tracheal aspirate)     Status: None  ° Collection Time: 02/15/19 11:51 PM  °Result Value Ref Range Status  ° Specimen Description TRACHEAL ASPIRATE  Final  ° Special Requests NONE  Final  ° Gram Stain   Final  °  MODERATE WBC PRESENT, PREDOMINANTLY MONONUCLEAR °FEW GRAM POSITIVE RODS °FEW GRAM POSITIVE COCCI °  ° Culture   Final  °  RARE Consistent with normal respiratory flora. °Performed at [facility], [address] [place], [place] [address] °  ° Report Status 02/18/2019 FINAL  Final  °Respiratory Panel by PCR     Status: None  ° Collection Time: 02/16/19  4:02 AM  °Result Value Ref Range Status  ° Adenovirus NOT DETECTED NOT DETECTED Final  ° Coronavirus 229E NOT DETECTED NOT DETECTED Final  °  Comment: (NOTE) °The Coronavirus on the Respiratory Panel, DOES NOT test for the novel  °Coronavirus (2019 nCoV) °  ° Coronavirus HKU1 NOT DETECTED NOT DETECTED Final  ° Coronavirus NL63 NOT DETECTED NOT DETECTED Final  ° Coronavirus OC43 NOT DETECTED NOT DETECTED Final  ° Metapneumovirus NOT  DETECTED NOT DETECTED Final  ° Rhinovirus / Enterovirus NOT DETECTED NOT DETECTED Final  ° Influenza A NOT DETECTED NOT DETECTED Final  ° Influenza B NOT DETECTED NOT DETECTED Final  ° Parainfluenza Virus 1 NOT DETECTED NOT DETECTED Final  ° Parainfluenza Virus 2 NOT DETECTED NOT DETECTED Final  ° Parainfluenza Virus 3 NOT DETECTED NOT DETECTED Final  ° Parainfluenza Virus 4 NOT DETECTED NOT DETECTED Final  ° Respiratory Syncytial Virus NOT DETECTED NOT DETECTED Final  ° Bordetella pertussis NOT DETECTED NOT DETECTED Final  ° Chlamydophila pneumoniae NOT DETECTED NOT DETECTED Final  ° Mycoplasma pneumoniae NOT DETECTED NOT DETECTED Final  °  Comment: Performed at [facility], [address] [place], [place] [address]  °Novel Coronavirus, NAA (hospital order; send-out to ref lab)     Status: Abnormal  ° Collection Time: 02/16/19  2:49 PM  °Result Value Ref Range Status  ° SARS-CoV-2, NAA DETECTED (A) NOT DETECTED Final  °  Comment: Positive (Detected) results are indicative of active infection with SARS CoV 2. A positive result does not rule out bacterial infection or coinfection with other viruses. °Detection of SARS CoV 2 viral RNA may not indicate that SARS CoV 2 is the causative agent for clinical symptoms. Positive and negative predictive values of testing are highly dependent on prevalence. °False positive   viral RNA may not indicate that SARS CoV 2 is the causative agent for clinical symptoms. Positive and negative predictive values of testing are highly dependent on prevalence. False positive test results are more likely when prevalence is moderate to low. RESULT CALLED TO, READ BACK BY AND VERIFIED WITH: NBeckie Salts 0121 02/20/2019 T. TYSOR (NOTE) The expected result is Negative (Not Detected). The SARS CoV 2 test is intended for the presumptive qualitative  detection of nucleic acid from SARS CoV 2 in upper and lower  respiratory specimens. Testing methodology is real time RT PCR. Test results must be correlated with clinical presentation and  evaluated in the context of other laboratory and epidemiologic data.  Test performance can be affected because the epidemiology and    cli nical spectrum of infection caused by SARS CoV 2 is not fully  known. For example, the optimum types of specimens to collect and  when during the course of infection these specimens are most likely  to contain detectable viral RNA may not be known. This test has not been Food and Drug Administration (FDA) cleared or  approved and has been authorized by FDA under an Emergency Use  Authorization (EUA). The test is only authorized for the duration of  the declaration that circumstances exist justifying the authorization  of emergency use of in vitro diagnostic tests for detection and or  diagnosis of SARS CoV 2 under Section 564(b)(1) of the Act, 21 U.S.C.  section [phone] 3(b)(1), unless the authorization is terminated or  revoked sooner. [facility] is certified under the  Clinical Laboratory Improvement Amendments of 1988 (CLIA), 42 U.S.C.  section [phone], to perform high complexity tests. Performed at [id number] [address], [place], [place] [address] Laboratory Director: Loleta Books, MD Fact Sheet for Healthcare Providers  BankingDealers.co.za Fact Sheet for Patients  StrictlyIdeas.no    Coronavirus Source NASOPHARYNGEAL  Final    Comment: Performed at [facility], [address], [place], [place] [address]    Radiology Reports Dg Chest 1 View  Result Date: 02/20/2019 CLINICAL DATA:  Evaluate endotracheal tube EXAM: CHEST  1 VIEW COMPARISON:  02/20/2019, 02/19/2019 FINDINGS: Endotracheal tube with the tip 5.3 cm above the carina. Nasogastric tube coursing below the diaphragm. Left jugular central venous catheter with the tip projecting over the SVC. Bilateral peripheral interstitial and alveolar airspace opacities improved compared from x-ray performed earlier same day. No pleural effusion or pneumothorax. Stable cardiomegaly. No acute osseous  abnormality. IMPRESSION: 1. Support lines and tubing in satisfactory position. 2. Bilateral interstitial and alveolar airspace opacities with improved aeration compared with the most recent prior examination performed earlier same day. Electronically Signed   By: Kathreen Devoid   On: 02/20/2019 19:32   Dg Chest Port 1 View  Result Date: 02/21/2019 CLINICAL DATA:  Acute respiratory failure with hypoxemia. EXAM: PORTABLE CHEST 1 VIEW COMPARISON:  02/20/2019 FINDINGS: Patient is rotated to the right. Endotracheal tube has tip 7 cm above the carina. Enteric tube courses into the region of the stomach and off the film as tip is not visualized. Left IJ central venous catheter unchanged. Lungs are adequately inflated. Mild hazy prominence of the infrahilar vasculature likely mild vascular congestion/edema. No effusion. Stable cardiomegaly. Remainder of the exam is unchanged. IMPRESSION: Mild stable cardiomegaly with suggestion mild vascular congestion. Tubes and lines as described. Electronically Signed   By: Marin Olp M.D.   On: 02/21/2019 16:31  Dg Chest Port 1 View  Result Date: 02/20/2019 CLINICAL DATA:  Respiratory failure EXAM: PORTABLE CHEST 1 VIEW COMPARISON:  02/19/2019 FINDINGS: Endotracheal tube, NG tube, left jugular central venous catheter are stable. Diffuse bilateral airspace opacities are stable. Normal heart size. No pneumothorax. IMPRESSION: Stable support apparatus and diffuse bilateral airspace disease. Electronically Signed   By: Marybelle Killings M.D.   On: 02/20/2019 07:26   Dg Chest Port 1 View  Result Date: 02/19/2019 CLINICAL DATA:  Respiratory failure.  COVID-19 EXAM: PORTABLE CHEST 1 VIEW COMPARISON:  02/18/2019 FINDINGS: Prone positioning. Endotracheal tube in good position. Left jugular catheter tip in the SVC. NG in the stomach. Cardiac enlargement. Diffuse bilateral airspace disease unchanged. No pneumothorax or effusion. IMPRESSION: Diffuse bilateral airspace disease unchanged.  Support lines in good position and unchanged. Electronically Signed   By: Franchot Gallo M.D.   On: 02/19/2019 08:08   Dg Chest Port 1 View  Result Date: 02/18/2019 CLINICAL DATA:  Ventilator dependent respiratory failure. Suspected COVID-19 infection. EXAM: PORTABLE CHEST 1 VIEW COMPARISON:  02/17/2019 and earlier. FINDINGS: Endotracheal tube tip in satisfactory position approximately 6 cm above the carina. RIGHT-sided central venous catheter crosses the midline with its tip likely in the LEFT innominate vein. Gastric tube difficult to visualize distally due to radiographic technique but likely courses below the diaphragm. Worsening airspace consolidation throughout the RIGHT lung and in the LEFT UPPER LOBE. Stable airspace consolidation in the LEFT LOWER LOBE. Stable cardiomegaly. Pulmonary venous hypertension without overt edema, unchanged. IMPRESSION: 1. Support apparatus satisfactory. 2. Worsening pneumonia throughout the RIGHT lung and in the LEFT UPPER LOBE. Stable LEFT LOWER LOBE pneumonia. 3. Stable cardiomegaly. Pulmonary venous hypertension without overt edema. Electronically Signed   By: Evangeline Dakin M.D.   On: 02/18/2019 08:03   Dg Chest Port 1 View  Result Date: 02/17/2019 CLINICAL DATA:  Intubation EXAM: PORTABLE CHEST 1 VIEW COMPARISON:  02/17/2019 FINDINGS: endotracheal tube is 5 cm above the carina. NG tube enters the stomach. Cardiomegaly, vascular congestion. Bilateral interstitial prominence and lower lobe airspace opacities, likely edema. Small effusions suspected. No acute bony abnormality. IMPRESSION: Endotracheal tube 5 cm above the carina. Cardiomegaly with vascular congestion, interstitial prominence and lower lobe opacities, likely edema. Small layering effusions. Electronically Signed   By: Rolm Baptise M.D.   On: 02/17/2019 16:55   Dg Chest Port 1 View  Result Date: 02/17/2019 CLINICAL DATA:  Respiratory failure. Coronavirus positive patient. The image was taken prone.  EXAM: PORTABLE CHEST 1 VIEW COMPARISON:  Single-view of the chest earlier today and 02/16/2019. FINDINGS: Support tubes and lines are unchanged. Bilateral airspace disease persists without change. Heart size is appears enlarged. No pneumothorax or pleural effusion. IMPRESSION: No change in support apparatus or left worse than right airspace disease since the examination earlier today. Electronically Signed   By: Inge Rise M.D.   On: 02/17/2019 11:46   Dg Chest Port 1 View  Result Date: 02/17/2019 CLINICAL DATA:  Respiratory failure. Possible COVID-19. patient in prone position. EXAM: PORTABLE CHEST 1 VIEW COMPARISON:  02/16/2019 FINDINGS: Endotracheal tube tip is 5 cm above the carina. Orogastric or nasogastric tube enters the abdomen. Left internal jugular central line tip in the SVC at the azygos level. Bilateral patchy pulmonary infiltrates persist, similar allowing for technical differences. IMPRESSION: Persistent bilateral patchy pulmonary infiltrates. Lines and tubes satisfactory. Today's study is done prone. Electronically Signed   By: Nelson Chimes M.D.   On: 02/17/2019 06:20   Dg Chest [facility] 1 [address]  jugular central venous catheter projects over the brachiocephalic/SVC confluence. No pneumothorax. Endotracheal tube tip 6.3 cm from the carina and the thoracic inlet. Enteric tube in place, not well visualized distal to the lower esophagus. Again seen cardiomegaly. No significant change in mixed interstitial and airspace opacities, side for equivocal worsening at the left lung base. IMPRESSION: 1. Tip of the left internal jugular central venous catheter projects over the brachiocephalic/SVC confluence. No pneumothorax. 2. Unchanged cardiomegaly. Bilateral mixed interstitial and airspace opacities grossly stable, with  equivocal worsening at the left lung base. Electronically Signed   By: Melanie  Sanford M.D.   On: 02/16/2019 19:12  ° °Dg Chest Port 1 View ° °Result Date: 02/16/2019 °CLINICAL DATA:  67-year-old female with respiratory failure EXAM: PORTABLE CHEST 1 VIEW COMPARISON:  02/15/2019 FINDINGS: Cardiomediastinal silhouette unchanged in size and contour. Cardiomegaly. Endotracheal tube terminates 5.6 cm above the carina. Gastric tube terminates out of the field of view. Improving aeration of the bilateral lungs, with persisting mixed interstitial and airspace opacities and thickening of the minor fissure. No pneumothorax. No large pleural effusion. IMPRESSION: Persisting mixed interstitial and airspace opacities, with slight improved aeration from the prior plain film. Unchanged endotracheal tube and gastric tube Electronically Signed   By: Jaime  Wagner D.O.   On: 02/16/2019 08:09  ° °Dg Chest Port 1 View ° °Result Date: 02/15/2019 °CLINICAL DATA:  Intubation. EXAM: PORTABLE CHEST 1 VIEW COMPARISON:  Radiograph earlier this day at [facility] FINDINGS: Endotracheal tube tip 5.1 cm from the carina. Enteric tube in place, tip likely below the diaphragm but not well visualized distal to the lower thorax. Progression in diffuse bilateral interstitial and patchy airspace disease from exam earlier this day. Slight cardiomegaly compared to prior. Hazy opacity at the left lung base may be developing effusion or soft tissue attenuation. No pneumothorax. IMPRESSION: 1. Endotracheal tube tip 5.1 cm from the carina. Enteric tube in place, tip likely below the diaphragm but not well visualized in the lower thorax. 2. Progression in diffuse bilateral interstitial and patchy airspace disease from exam earlier this day, favoring multifocal pneumonia over pulmonary edema. Slight enlargement of the cardiac silhouette and possible developing left pleural effusion are new. Electronically Signed   By: Melanie  Sanford M.D.   On: 02/15/2019  21:51  ° ° °

## 2019-02-22 NOTE — Progress Notes (Signed)
ANTICOAGULATION CONSULT NOTE - Initial Consult  Pharmacy Consult for Lovenox Indication: VTE prophylaxis  No Known Allergies  Patient Measurements: Height: 5' 4" (162.6 cm) Weight: 285 lb (129.3 kg) IBW/kg (Calculated) : 54.7 Heparin Dosing Weight:   Vital Signs: Temp: 100.6 F (38.1 C) (04/15 0800) Temp Source: Axillary (04/15 0800) BP: 153/76 (04/15 0845) Pulse Rate: 86 (04/15 0845)  Labs: Recent Labs    02/20/19 0345  02/21/19 0339 02/21/19 0411 02/21/19 1554 02/22/19 0500  HGB 12.7   < > 10.2* 12.9 14.3  --   HCT 43.3   < > 34.9* 38.0 42.0  --   PLT 211  --  204  --   --   --   CREATININE 0.96  --  0.73  --   --  0.77   < > = values in this interval not displayed.    Estimated Creatinine Clearance: 89.8 mL/min (by C-G formula based on SCr of 0.77 mg/dL).   Medical History: No past medical history on file.  Medications:  Medications Prior to Admission  Medication Sig Dispense Refill Last Dose  . DULoxetine (CYMBALTA) 20 MG capsule Take 20 mg by mouth daily.   unknown  . loperamide (IMODIUM) 2 MG capsule Take 2 mg by mouth as needed for diarrhea or loose stools.   unknown  . pseudoephedrine (SUDAFED) 30 MG tablet Take 30 mg by mouth every 4 (four) hours as needed for congestion.   02/15/2019 at Unknown time  . QUEtiapine (SEROQUEL) 200 MG tablet Take 200 mg by mouth daily.   unknown  . rOPINIRole (REQUIP) 2 MG tablet Take 2 mg by mouth 2 (two) times daily as needed.    unknown  . simvastatin (ZOCOR) 20 MG tablet Take 20 mg by mouth daily.   unknown   Scheduled:  . chlorhexidine gluconate (MEDLINE KIT)  15 mL Mouth Rinse BID  . Chlorhexidine Gluconate Cloth  6 each Topical Daily  . clonazePAM  1 mg Oral BID  . enoxaparin (LOVENOX) injection  60 mg Subcutaneous Q24H  . feeding supplement (PRO-STAT SUGAR FREE 64)  30 mL Per Tube TID  . feeding supplement (VITAL HIGH PROTEIN)  1,000 mL Per Tube Q24H  . guaiFENesin  15 mL Oral Q6H  . ipratropium-albuterol  3 mL  Nebulization Q6H  . mouth rinse  15 mL Mouth Rinse 10 times per day  . oxyCODONE  10 mg Oral Q4H  . pantoprazole sodium  40 mg Per Tube Q24H  . polyethylene glycol  17 g Oral Daily  . potassium chloride  40 mEq Oral BID  . sodium chloride HYPERTONIC  4 mL Nebulization BID   Infusions:  . sodium chloride 10 mL/hr at 02/21/19 1641  . HYDROmorphone 1 mg/hr (02/22/19 0700)  . ketamine infusion 500 mg in sodium chloride 0.9% 100 mL 0.94 mg/kg/hr (02/22/19 0700)  . propofol (DIPRIVAN) infusion Stopped (02/22/19 1000)    Assessment: Pt was tx here from [facility] for her vent management for COVID. She has been on Lovenox 25m for her DVT prophylaxis. She weighs 129kg. D/w Dr. GSloan Leiterto increase the dose to about 0.555mkg for her DVT prophylaxis. She has normal renal function.   Goal of Therapy:  Anti-Xa 0.3-0.6 Monitor platelets by anticoagulation protocol: Yes   Plan:   Lovenox 6043mQ q24 F/u CBC  MinOnnie BoerharmD, BCIDP, AAHIVP, CPP Infectious Disease Pharmacist 02/22/2019 10:34 AM

## 2019-02-23 ENCOUNTER — Inpatient Hospital Stay (HOSPITAL_COMMUNITY): Payer: Medicare Other

## 2019-02-23 DIAGNOSIS — G9340 Encephalopathy, unspecified: Secondary | ICD-10-CM

## 2019-02-23 LAB — COMPREHENSIVE METABOLIC PANEL
ALT: 32 U/L (ref 0–44)
AST: 27 U/L (ref 15–41)
Albumin: 3.5 g/dL (ref 3.5–5.0)
Alkaline Phosphatase: 108 U/L (ref 38–126)
Anion gap: 9 (ref 5–15)
BUN: 28 mg/dL — ABNORMAL HIGH (ref 8–23)
CO2: 33 mmol/L — ABNORMAL HIGH (ref 22–32)
Calcium: 8.9 mg/dL (ref 8.9–10.3)
Chloride: 102 mmol/L (ref 98–111)
Creatinine, Ser: 0.68 mg/dL (ref 0.44–1.00)
GFR calc Af Amer: >60 mL/min (ref >60)
GFR calc non Af Amer: >60 mL/min (ref >60)
Glucose, Bld: 168 mg/dL — ABNORMAL HIGH (ref 70–99)
Potassium: 3.8 mmol/L (ref 3.5–5.1)
Sodium: 144 mmol/L (ref 135–145)
Total Bilirubin: 0.6 mg/dL (ref 0.3–1.2)
Total Protein: 6.6 g/dL (ref 6.5–8.1)

## 2019-02-23 LAB — POCT I-STAT 7, (LYTES, BLD GAS, ICA,H+H)
Acid-Base Excess: 9 mmol/L — ABNORMAL HIGH (ref 0.0–2.0)
Acid-Base Excess: 9 mmol/L — ABNORMAL HIGH (ref 0.0–2.0)
Bicarbonate: 36.2 mmol/L — ABNORMAL HIGH (ref 20.0–28.0)
Bicarbonate: 35.8 mmol/L — ABNORMAL HIGH (ref 20.0–28.0)
Calcium, Ion: 1.21 mmol/L (ref 1.15–1.40)
Calcium, Ion: 1.19 mmol/L (ref 1.15–1.40)
HCT: 41 % (ref 36.0–46.0)
HCT: 40 % (ref 36.0–46.0)
Hemoglobin: 13.9 g/dL (ref 12.0–15.0)
Hemoglobin: 13.6 g/dL (ref 12.0–15.0)
O2 Saturation: 98 %
O2 Saturation: 84 %
Patient temperature: 98.6
Patient temperature: 100.5
Potassium: 3.9 mmol/L (ref 3.5–5.1)
Potassium: 3.8 mmol/L (ref 3.5–5.1)
Sodium: 144 mmol/L (ref 135–145)
Sodium: 145 mmol/L (ref 135–145)
TCO2: 38 mmol/L — ABNORMAL HIGH (ref 22–32)
TCO2: 38 mmol/L — ABNORMAL HIGH (ref 22–32)
pCO2 arterial: 58.4 mmHg — ABNORMAL HIGH (ref 32.0–48.0)
pCO2 arterial: 60.4 mmHg — ABNORMAL HIGH (ref 32.0–48.0)
pH, Arterial: 7.4 (ref 7.350–7.450)
pH, Arterial: 7.385 (ref 7.350–7.450)
pO2, Arterial: 100 mmHg (ref 83.0–108.0)
pO2, Arterial: 54 mmHg — ABNORMAL LOW (ref 83.0–108.0)

## 2019-02-23 LAB — CBC
HCT: 43.8 % (ref 36.0–46.0)
Hemoglobin: 13.1 g/dL (ref 12.0–15.0)
MCH: 29.6 pg (ref 26.0–34.0)
MCHC: 29.9 g/dL — ABNORMAL LOW (ref 30.0–36.0)
MCV: 99.1 fL (ref 80.0–100.0)
Platelets: 267 10*3/uL (ref 150–400)
RBC: 4.42 MIL/uL (ref 3.87–5.11)
RDW: 14.2 % (ref 11.5–15.5)
WBC: 5.2 10*3/uL (ref 4.0–10.5)
nRBC: 0 % (ref 0.0–0.2)

## 2019-02-23 LAB — GLUCOSE, CAPILLARY
Glucose-Capillary: 152 mg/dL — ABNORMAL HIGH (ref 70–99)
Glucose-Capillary: 175 mg/dL — ABNORMAL HIGH (ref 70–99)
Glucose-Capillary: 190 mg/dL — ABNORMAL HIGH (ref 70–99)
Glucose-Capillary: 154 mg/dL — ABNORMAL HIGH (ref 70–99)
Glucose-Capillary: 208 mg/dL — ABNORMAL HIGH (ref 70–99)
Glucose-Capillary: 168 mg/dL — ABNORMAL HIGH (ref 70–99)

## 2019-02-23 MED ORDER — LABETALOL HCL 5 MG/ML IV SOLN
10.0000 mg | INTRAVENOUS | Status: DC | PRN
  Administered 2019-02-23: 11:00:00 10 mg via INTRAVENOUS
  Filled 2019-02-23 (×2): qty 4

## 2019-02-23 MED ORDER — PRO-STAT SUGAR FREE PO LIQD
60.0000 mL | Freq: Two times a day (BID) | ORAL | Status: DC
  Administered 2019-02-23 – 2019-02-25 (×4): 60 mL
  Filled 2019-02-23 (×4): qty 60

## 2019-02-23 MED ORDER — QUETIAPINE FUMARATE 50 MG PO TABS: 50.0000 mg | ORAL_TABLET | Freq: Two times a day (BID) | ORAL | Status: DC

## 2019-02-23 MED ORDER — OSMOLITE 1.2 CAL PO LIQD
1000.0000 mL | ORAL | Status: DC
  Administered 2019-02-23: 16:00:00 1000 mL
  Filled 2019-02-23 (×4): qty 1000

## 2019-02-23 MED ORDER — INSULIN ASPART 100 UNIT/ML ~~LOC~~ SOLN
4.0000 [IU] | SUBCUTANEOUS | Status: DC
  Administered 2019-02-23 – 2019-02-25 (×13): 4 [IU] via SUBCUTANEOUS

## 2019-02-23 MED ORDER — OXYCODONE HCL 5 MG PO TABS
10.0000 mg | ORAL_TABLET | ORAL | Status: DC
  Administered 2019-02-23 – 2019-02-25 (×12): 10 mg
  Filled 2019-02-23 (×12): qty 2

## 2019-02-23 MED ORDER — CLONAZEPAM 1 MG PO TABS
1.0000 mg | ORAL_TABLET | Freq: Two times a day (BID) | ORAL | Status: DC
  Administered 2019-02-23 – 2019-02-25 (×4): 1 mg
  Filled 2019-02-23 (×4): qty 1

## 2019-02-23 MED ORDER — FUROSEMIDE 10 MG/ML IJ SOLN
40.0000 mg | Freq: Two times a day (BID) | INTRAMUSCULAR | Status: AC
  Administered 2019-02-23 (×2): 40 mg via INTRAVENOUS
  Filled 2019-02-23 (×2): qty 4

## 2019-02-23 MED ORDER — GUAIFENESIN 100 MG/5ML PO SOLN
15.0000 mL | Freq: Four times a day (QID) | ORAL | Status: DC
  Administered 2019-02-23 – 2019-02-25 (×8): 300 mg
  Filled 2019-02-23 (×3): qty 15
  Filled 2019-02-23: qty 45
  Filled 2019-02-23 (×7): qty 15

## 2019-02-23 MED ORDER — QUETIAPINE FUMARATE 50 MG PO TABS
50.0000 mg | ORAL_TABLET | Freq: Two times a day (BID) | ORAL | Status: DC
  Administered 2019-02-23 – 2019-02-25 (×5): 50 mg
  Filled 2019-02-23 (×5): qty 1

## 2019-02-23 MED ORDER — IPRATROPIUM-ALBUTEROL 0.5-2.5 (3) MG/3ML IN SOLN: 3.0000 mL | Freq: Four times a day (QID) | RESPIRATORY_TRACT | Status: DC | PRN

## 2019-02-23 MED ORDER — FUROSEMIDE 10 MG/ML IJ SOLN: 40.0000 mg | Freq: Once | INTRAMUSCULAR | Status: DC

## 2019-02-23 NOTE — Progress Notes (Signed)
NAME:  Sherry Becker, MRN:  [id number], DOB:  1951/07/12, LOS: 8 ADMISSION DATE:  02/15/2019, CONSULTATION DATE:  02/15/19 REFERRING MD:  [facility]  CHIEF COMPLAINT:  SOB   Brief History   Sherry Becker is a 68 y.o. female who was admitted 4/8 after transfer from [facility] with acute hypoxic respiratory failure requiring intubation in the setting of bilateral infiltrates.  COVID positive.      Past Medical History  No definitive history but per med list from [facility], suspect combo of depression and mood disorder along with HLD.  Probable OSA.  Significant Hospital Events   4/08 Admit, intubated. 4/09 Prone  4/10 LCB 4/13 Prone 0230. Remains on ketamine, fentanyl, propofol gtt's >transition to dilaudid gtt  Consults:     Procedures:  ETT 4/8 >>  CVC 4/9 >> L Rad A Line 4/9 >>   Significant Diagnostic Tests:  CXR 4/8 >> extensive bilateral pulmonary infiltrates. LE duplex 4/8 >> not done due to airborne precautions >>  Micro Data:  Blood 4/8 >> negative Sputum 4/8 >> normal flora UC 4/8 >> negative  RVP 4/9 >> negative  COVID 4/9 >> POSITIVE  Quantiferon gold 4/9 >> negative  Antimicrobials:  Azithromycin 4/8 >> 4/14 Ceftriaxone 4/8 >> 4/14 Plaquenil 4/6 >> completed   Interim history/subjective:  No events overnight.     Objective:  Blood pressure (!) 151/60, pulse 90, temperature (!) 100.5 F (38.1 C), temperature source Axillary, resp. rate 14, height  (1.626 m), weight 135 kg, SpO2 91 %.    Vent Mode: CPAP;PSV FiO2 (%):  [30 %-70 %] 30 % Set Rate:  [30 bmp] 30 bmp Vt Set:  [400 mL] 400 mL PEEP:  [5 cmH20-14 cmH20] 5 cmH20 Pressure Support:  [5 cmH20] 5 cmH20 Plateau Pressure:  [22 cmH20-24 cmH20] 24 cmH20   Intake/Output Summary (Last 24 hours) at 02/23/2019 1406 Last data filed at 02/23/2019 1300 Gross per 24 hour  Intake 1348.96 ml  Output 2805 ml  Net -1456.04 ml   Filed Weights   02/20/19 0356 02/21/19 0500 02/23/19 0433   Weight: 129.3 kg 129.3 kg 135 kg    Examination: General: Acutely ill female, NAD HEENT: /AT, PERRL, EOM-I and MMM Neuro: Sedate, withdraws to pain CV: RRR, Nl S1/S2 and -M/R/G PULM: Coarse BS diffusely GI: Soft, NT, ND and +BS Extremities: warm/dry, generalized 1-2+ edema  Skin: no rashes or lesions  Assessment & Plan:   Acute Hypoxic Respiratory Failure secondary to SARS-CoV2 -PaO2/FiO2 ratio 115 4/14 > 142 4/14 -s/p Actmera 4/9 P: Continue ARDS protocol, goal SaO2 greater than 88% Continue supine for now Diureses as able D/C propofol Continue sedation with Klonopin, oxycodone, Dilaudid ketamine for now  Best Practice:  Diet: TF's. Pain/Anxiety/Delirium protocol (if indicated): In place,  RASS goal -3 VAP protocol (if indicated): In place. DVT prophylaxis: Lovenox GI prophylaxis: PPI. Glucose control: N/A. Mobility: Bedrest. Code Status: Full. Family Communication: per TRH.  Disposition: ICU.  Labs   CBC: Recent Labs  Lab 02/17/19 0247  02/18/19 0427  02/19/19 0424  02/20/19 0345  02/21/19 0339  02/21/19 1554 02/22/19 1146 02/23/19 0420 02/23/19 0515 02/23/19 1155  WBC 2.9*  --  3.0*  --  4.0  --  5.3  --  4.7  --   --   --  5.2  --   --   NEUTROABS 1.5*  --  2.5  --  2.8  --  3.7  --   --   --   --   --   --   --   --  HGB 11.8*   < > 12.0   < > 12.7   < > 12.7   < > 10.2*   < > 14.3 13.6 13.1 13.9 13.6  HCT 38.9   < > 41.1   < > 43.7   < > 43.3   < > 34.9*   < > 42.0 40.0 43.8 41.0 40.0  MCV 96.0  --  99.8  --  99.1  --  99.1  --  100.6*  --   --   --  99.1  --   --   PLT 155  --  177  --  192  --  211  --  204  --   --   --  267  --   --    < > = values in this interval not displayed.   Basic Metabolic Panel: Recent Labs  Lab 02/17/19 0247  02/18/19 0427  02/19/19 0424  02/20/19 0345  02/21/19 0339  02/21/19 0530  02/22/19 0500 02/22/19 1146 02/23/19 0420 02/23/19 0515 02/23/19 1155  NA 143   < > 144   < > 145   < > 145   < > 145   < >   --    < > 144 143 144 144 145  K 2.8*   < > 3.6   < > 3.9   < > 3.9   < > 2.7*   < >  --    < > 3.4* 4.4 3.8 3.9 3.8  CL 99   < > 100  --  100  --  96*  --  110  --   --   --  100  --  102  --   --   CO2 33*   < > 31  --  33*  --  36*  --  27  --   --   --  33*  --  33*  --   --   GLUCOSE 98   < > 101*  --  115*  --  113*  --  90  --   --   --  118*  --  168*  --   --   BUN 9   < > 11  --  8  --  12  --  10  --   --   --  19  --  28*  --   --   CREATININE 0.73   < > 0.82  --  0.74  --  0.96  --  0.73  --   --   --  0.77  --  0.68  --   --   CALCIUM 6.9*   < > 7.6*  --  7.9*  --  8.6*  --  6.8*  --   --   --  8.7*  --  8.9  --   --   MG 1.7  --  1.9  --  1.9  --  1.9  --   --   --  2.0  --  2.3  --   --   --   --   PHOS 2.7  --  3.7  --  4.1  --  4.6  --   --   --   --   --   --   --   --   --   --    < > = values in this interval not displayed.   GFR:  Estimated Creatinine Clearance: 92.2 mL/min (by C-G formula based on SCr of 0.68 mg/dL). Recent Labs  Lab 02/19/19 0424 02/20/19 0345 02/21/19 0339 02/23/19 0420  PROCALCITON  --  <0.10  --   --   WBC 4.0 5.3 4.7 5.2   Liver Function Tests: Recent Labs  Lab 02/18/19 0427 02/19/19 0424 02/23/19 0420  AST 24 31 27   ALT 19 25 32  ALKPHOS 95 101 108  BILITOT 0.7 1.0 0.6  PROT 5.4* 5.4* 6.6  ALBUMIN 2.5* 2.7* 3.5   No results for input(s): LIPASE, AMYLASE in the last 168 hours. No results for input(s): AMMONIA in the last 168 hours.   ABG    Component Value Date/Time   PHART 7.385 02/23/2019 1155   PCO2ART 60.4 (H) 02/23/2019 1155   PO2ART 54.0 (L) 02/23/2019 1155   HCO3 35.8 (H) 02/23/2019 1155   TCO2 38 (H) 02/23/2019 1155   O2SAT 84.0 02/23/2019 1155    Coagulation Profile: No results for input(s): INR, PROTIME in the last 168 hours. Cardiac Enzymes: No results for input(s): CKTOTAL, CKMB, CKMBINDEX, TROPONINI in the last 168 hours. HbA1C: No results found for: HGBA1C CBG: Recent Labs  Lab 02/22/19 1943 02/22/19  2305 02/23/19 0320 02/23/19 0800 02/23/19 1127  GLUCAP 158* 159* 152* 175* 190*   The patient is critically ill with multiple organ systems failure and requires high complexity decision making for assessment and support, frequent evaluation and titration of therapies, application of advanced monitoring technologies and extensive interpretation of multiple databases.   Critical Care Time devoted to patient care services described in this note is  32  Minutes. This time reflects time of care of this signee Dr Koren BoundWesam Markus Casten. This critical care time does not reflect procedure time, or teaching time or supervisory time of PA/NP/Med student/Med Resident etc but could involve care discussion time.  Alyson ReedyWesam G. Mistey Hoffert, M.D. [facility]. Pager: [phone]. After hours pager: [phone].

## 2019-02-23 NOTE — Progress Notes (Signed)
PROGRESS NOTE                                                                                                                                                                                                             Patient Demographics:    Sherry Becker, is a 68 y.o. female, DOB - 1951/07/13, ZOX:[id number]  Outpatient Primary MD for the patient is Patient, No Pcp Per    LOS - 8  No chief complaint on file.      Brief Narrative: Patient is a 68 y.o. female with PMHx of depression, probable OSA-who presented from [facility] as a transfer on 4/8 with acute hypoxic respiratory failure secondary to COVID 19 viral pneumonia.   Significant events: 4/08 Admit, intubated. 4/8>>4/13 Plaquenil 4/9 Actemra 4/09 Prone  4/10 LCB 4/13 Prone 0230. Remains on ketamine, fentanyl, propofol gtt's >transition to dilaudid gtt 4/14 Transfer to [facility] 4/14  Mucus plugging/prone   Subjective:    Anmol Paschen today is somewhat awake on the ventilator-opens eyes-occasionally will grasp my fingers and hands.  Low grade fever this am.   Assessment  & Plan :   Acute hypoxic respiratory failure secondary to ARDS due to COVID-19 viral pneumonia: Continue full ventilator support-underwent proning 4/14. CXR with worsening infiltrates today-will given IV lasix today.  Has not received fever-we will watch closely and reculture if persists or temp> 101 F.  Patient is s/p Actemra on 4/9-and completed Plaquenil.  Blood cultures/Respiratory virus panel negative.  PCCM following.  Hypokalemia: Repleted-recheck tomorrow.  Hypotension: Resolved-possibly related to sedative agents.  Bradycardia: Resolved-thought to be related to sedative agents  Depression/anxiety: Continue Klonopin-resume Seroquel-Cymbalta on hold-we will resume when able  Morbid obesity  Vent Settings: Vent Mode: PRVC FiO2 (%):  [60 %-70 %] 60 % Set Rate:  [30 bmp] 30  bmp Vt Set:  [400 mL] 400 mL PEEP:  [12 cmH20-14 cmH20] 14 cmH20 Plateau Pressure:  [22 cmH20-24 cmH20] 24 cmH20  ABG:    Component Value Date/Time   PHART 7.400 02/23/2019 0515   PCO2ART 58.4 (H) 02/23/2019 0515   PO2ART 100.0 02/23/2019 0515   HCO3 36.2 (H) 02/23/2019 0515   TCO2 38 (H) 02/23/2019 0515   O2SAT 98.0 02/23/2019 0515    Condition - Extremely Guarded  Family Communication  : Daughter updated over the phone on 4/16  Code  Status : Partial code  Diet : NPO-NG feeds in place  Disposition Plan  :  Remain in the ICU  Consults  :  PCCM  Procedures  :   ETT 4/8>> CVC/left IJ 4/9>>  GI prophylaxis: PPI  DVT Prophylaxis  :  Lovenox  Lab Results  Component Value Date   PLT 267 02/23/2019    Inpatient Medications  Scheduled Meds:  chlorhexidine gluconate (MEDLINE KIT)  15 mL Mouth Rinse BID   Chlorhexidine Gluconate Cloth  6 each Topical Daily   clonazePAM  1 mg Oral BID   enoxaparin (LOVENOX) injection  60 mg Subcutaneous Q24H   feeding supplement (PRO-STAT SUGAR FREE 64)  30 mL Per Tube TID   feeding supplement (VITAL HIGH PROTEIN)  1,000 mL Per Tube Q24H   guaiFENesin  15 mL Oral Q6H   ipratropium-albuterol  3 mL Nebulization Q6H   mouth rinse  15 mL Mouth Rinse 10 times per day   oxyCODONE  10 mg Oral Q4H   pantoprazole sodium  40 mg Per Tube Q24H   polyethylene glycol  17 g Oral Daily   Continuous Infusions:  sodium chloride 10 mL/hr at 02/21/19 1641   HYDROmorphone 0.5 mg/hr (02/23/19 0700)   ketamine infusion 500 mg in sodium chloride 0.9% 100 mL Stopped (02/22/19 1736)   propofol (DIPRIVAN) infusion Stopped (02/22/19 0957)   PRN Meds:.Place/Maintain arterial line **AND** sodium chloride, bisacodyl, hydrALAZINE, HYDROmorphone, midazolam  Antibiotics  :    Anti-infectives (From admission, onward)   Start     Dose/Rate Route Frequency Ordered Stop   02/16/19 2200  hydroxychloroquine (PLAQUENIL) tablet 200 mg     200 mg Per  Tube 2 times daily 02/15/19 2051 02/20/19 0908   02/15/19 2200  hydroxychloroquine (PLAQUENIL) tablet 400 mg     400 mg Per Tube 2 times daily 02/15/19 2051 02/16/19 2159   02/15/19 2130  azithromycin (ZITHROMAX) 500 mg in sodium chloride 0.9 % 250 mL IVPB  Status:  Discontinued     500 mg 250 mL/hr over 60 Minutes Intravenous Daily at bedtime 02/15/19 2051 02/20/19 0923   02/15/19 2130  cefTRIAXone (ROCEPHIN) 2 g in sodium chloride 0.9 % 100 mL IVPB  Status:  Discontinued     2 g 200 mL/hr over 30 Minutes Intravenous Daily at bedtime 02/15/19 2057 02/20/19 0923   02/15/19 2100  cefTRIAXone (ROCEPHIN) injection 1 g  Status:  Discontinued     1 g Intramuscular Every 24 hours 02/15/19 2051 02/15/19 2057       Time Spent in minutes  35 min  Oren Binet M.D on 02/23/2019 at 8:11 AM  To page go to [email or web address] - password [email or web address]  [facility] -  Office  [phone]  See all Orders from today for further details   Admit date - 02/15/2019      Objective:   Vitals:   02/23/19 0531 02/23/19 0600 02/23/19 0700 02/23/19 0705  BP:  (!) 156/78 (!) 156/92   Pulse:  79 81   Resp:  (!) 30 (!) 30   Temp:      TempSrc:      SpO2: (!) 88% (!) 89% (!) 88% (!) 88%  Weight:      Height:        Wt Readings from Last 3 Encounters:  02/23/19 135 kg     Intake/Output Summary (Last 24 hours) at 02/23/2019 0811 Last data filed at 02/23/2019 0700 Gross per 24 hour  Intake 862.74 ml  Output 2061 ml  Net -1198.26 ml     Physical Exam Gen Exam:Intubated-awakens with verbal stimuli HEENT:atraumatic, normocephalic Chest: B/L clear to auscultation anteriorly CVS:S1S2 regular Abdomen:soft non tender, non distended Extremities:+ edema Neurology: difficult to examine given sedation/intubation Skin: no rash   Data Review:    CBC Recent Labs  Lab 02/17/19 0247  02/18/19 0427  02/19/19 0424  02/20/19 0345  02/21/19 0339 02/21/19 0411 02/21/19 1554 02/22/19 1146  02/23/19 0420 02/23/19 0515  WBC 2.9*  --  3.0*  --  4.0  --  5.3  --  4.7  --   --   --  5.2  --   HGB 11.8*   < > 12.0   < > 12.7   < > 12.7   < > 10.2* 12.9 14.3 13.6 13.1 13.9  HCT 38.9   < > 41.1   < > 43.7   < > 43.3   < > 34.9* 38.0 42.0 40.0 43.8 41.0  PLT 155  --  177  --  192  --  211  --  204  --   --   --  267  --   MCV 96.0  --  99.8  --  99.1  --  99.1  --  100.6*  --   --   --  99.1  --   MCH 29.1  --  29.1  --  28.8  --  29.1  --  29.4  --   --   --  29.6  --   MCHC 30.3  --  29.2*  --  29.1*  --  29.3*  --  29.2*  --   --   --  29.9*  --   RDW 14.7  --  15.0  --  14.5  --  14.3  --  14.4  --   --   --  14.2  --   LYMPHSABS 1.2  --  0.4*  --  0.9  --  1.1  --   --   --   --   --   --   --   MONOABS 0.2  --  0.1  --  0.3  --  0.3  --   --   --   --   --   --   --   EOSABS 0.0  --  0.0  --  0.0  --  0.1  --   --   --   --   --   --   --   BASOSABS 0.0  --  0.0  --  0.0  --  0.0  --   --   --   --   --   --   --    < > = values in this interval not displayed.    Chemistries  Recent Labs  Lab 02/18/19 0427  02/19/19 0424  02/20/19 0345  02/21/19 0339  02/21/19 0530  02/21/19 1554 02/22/19 0500 02/22/19 1146 02/23/19 0420 02/23/19 0515  NA 144   < > 145   < > 145   < > 145   < >  --   --  141 144 143 144 144  K 3.6   < > 3.9   < > 3.9   < > 2.7*   < >  --    < > 4.6 3.4* 4.4 3.8 3.9  CL 100  --  100  --  96*  --  110  --   --   --   --  100  --  102  --   CO2 31  --  33*  --  36*  --  27  --   --   --   --  33*  --  33*  --   GLUCOSE 101*  --  115*  --  113*  --  90  --   --   --   --  118*  --  168*  --   BUN 11  --  8  --  12  --  10  --   --   --   --  19  --  28*  --   CREATININE 0.82  --  0.74  --  0.96  --  0.73  --   --   --   --  0.77  --  0.68  --   CALCIUM 7.6*  --  7.9*  --  8.6*  --  6.8*  --   --   --   --  8.7*  --  8.9  --   MG 1.9  --  1.9  --  1.9  --   --   --  2.0  --   --  2.3  --   --   --   AST 24  --  31  --   --   --   --   --   --   --   --    --   --  27  --   ALT 19  --  25  --   --   --   --   --   --   --   --   --   --  32  --   ALKPHOS 95  --  101  --   --   --   --   --   --   --   --   --   --  108  --   BILITOT 0.7  --  1.0  --   --   --   --   --   --   --   --   --   --  0.6  --    < > = values in this interval not displayed.   ------------------------------------------------------------------------------------------------------------------ Recent Labs    02/22/19 1645  TRIG 330*    No results found for: HGBA1C ------------------------------------------------------------------------------------------------------------------ No results for input(s): TSH, T4TOTAL, T3FREE, THYROIDAB in the last 72 hours.  Invalid input(s): FREET3 ------------------------------------------------------------------------------------------------------------------ No results for input(s): VITAMINB12, FOLATE, FERRITIN, TIBC, IRON, RETICCTPCT in the last 72 hours.  Coagulation profile No results for input(s): INR, PROTIME in the last 168 hours.  No results for input(s): DDIMER in the last 72 hours.  Cardiac Enzymes No results for input(s): CKMB, TROPONINI, MYOGLOBIN in the last 168 hours.  Invalid input(s): CK ------------------------------------------------------------------------------------------------------------------ No results found for: BNP  Micro Results Recent Results (from the past 240 hour(s))  MRSA PCR Screening     Status: None   Collection Time: 02/15/19  8:56 PM  Result Value Ref Range Status   MRSA by PCR NEGATIVE NEGATIVE Final    Comment:        The GeneXpert MRSA Assay (FDA approved for NASAL specimens only), is one component of a comprehensive MRSA colonization surveillance program. It is not intended to diagnose MRSA infection nor to guide or monitor treatment for MRSA infections. Performed at [facility], [place]  [address], [place], [place] [address]   Culture, blood (routine x 2)     Status:  None   Collection Time: 02/15/19  9:46 PM  Result Value Ref Range Status   Specimen Description BLOOD LEFT FOREARM  Final   Special Requests   Final    BOTTLES DRAWN AEROBIC ONLY Blood Culture adequate volume   Culture   Final    NO GROWTH 5 DAYS Performed at [facility], [place] [address] [place], [place] [address]    Report Status 02/20/2019 FINAL  Final  Urine culture     Status: None   Collection Time: 02/15/19  9:55 PM  Result Value Ref Range Status   Specimen Description URINE, RANDOM  Final   Special Requests NONE  Final   Culture   Final    NO GROWTH Performed at [facility], [place] [address] [place], [place] [address]    Report Status 02/16/2019 FINAL  Final  Culture, blood (routine x 2)     Status: None   Collection Time: 02/15/19  9:56 PM  Result Value Ref Range Status   Specimen Description BLOOD LEFT HAND  Final   Special Requests   Final    BOTTLES DRAWN AEROBIC AND ANAEROBIC Blood Culture adequate volume   Culture   Final    NO GROWTH 5 DAYS Performed at [facility], [place] [address] [place], [place] [address]    Report Status 02/20/2019 FINAL  Final  Culture, respiratory (tracheal aspirate)     Status: None   Collection Time: 02/15/19 11:51 PM  Result Value Ref Range Status   Specimen Description TRACHEAL ASPIRATE  Final   Special Requests NONE  Final   Gram Stain   Final    MODERATE WBC PRESENT, PREDOMINANTLY MONONUCLEAR FEW GRAM POSITIVE RODS FEW GRAM POSITIVE COCCI    Culture   Final    RARE Consistent with normal respiratory flora. Performed at [facility], [place] [address] [place], [place] [address]    Report Status 02/18/2019 FINAL  Final  Respiratory Panel by PCR     Status: None   Collection Time: 02/16/19  4:02 AM  Result Value Ref Range Status   Adenovirus NOT DETECTED NOT DETECTED Final   Coronavirus 229E NOT DETECTED NOT DETECTED Final    Comment: (NOTE) The Coronavirus on the Respiratory Panel, DOES NOT test for  the novel  Coronavirus (2019 nCoV)    Coronavirus HKU1 NOT DETECTED NOT DETECTED Final   Coronavirus NL63 NOT DETECTED NOT DETECTED Final   Coronavirus OC43 NOT DETECTED NOT DETECTED Final   Metapneumovirus NOT DETECTED NOT DETECTED Final   Rhinovirus / Enterovirus NOT DETECTED NOT DETECTED Final   Influenza A NOT DETECTED NOT DETECTED Final   Influenza B NOT DETECTED NOT DETECTED Final   Parainfluenza Virus 1 NOT DETECTED NOT DETECTED Final   Parainfluenza Virus 2 NOT DETECTED NOT DETECTED Final   Parainfluenza Virus 3 NOT DETECTED NOT DETECTED Final   Parainfluenza Virus 4 NOT DETECTED NOT DETECTED Final   Respiratory Syncytial Virus NOT DETECTED NOT DETECTED Final   Bordetella pertussis NOT DETECTED NOT DETECTED Final   Chlamydophila pneumoniae NOT DETECTED NOT DETECTED Final   Mycoplasma pneumoniae NOT DETECTED NOT DETECTED Final    Comment: Performed at [facility], [address], [place],  [address]  Novel Coronavirus, NAA (hospital order; send-out to ref lab)     Status: Abnormal   Collection Time: 02/16/19  2:49 PM  Result Value Ref Range Status   SARS-CoV-2, NAA DETECTED (A) NOT DETECTED Final    Comment: Positive (Detected) results are indicative of active infection with SARS CoV 2. A positive result does not rule out bacterial infection or coinfection with other viruses. Detection of SARS CoV 2 viral RNA may not indicate that SARS CoV 2 is the causative agent for clinical symptoms. Positive and negative predictive values of testing are highly dependent on prevalence. False positive test results are more likely when prevalence is moderate to low. RESULT CALLED TO, READ BACK BY AND VERIFIED WITH: NBeckie Salts 0121 02/20/2019 T. TYSOR (NOTE) The expected result is Negative (Not Detected). The SARS CoV 2 test is intended for the presumptive qualitative  detection of nucleic acid from SARS CoV 2 in upper and lower  respiratory specimens. Testing methodology is  real time RT PCR. Test results must be correlated with clinical presentation and  evaluated in the context of other laboratory and epidemiologic data.  Test performance can be affected because the epidemiology and  cli nical spectrum of infection caused by SARS CoV 2 is not fully  known. For example, the optimum types of specimens to collect and  when during the course of infection these specimens are most likely  to contain detectable viral RNA may not be known. This test has not been Food and Drug Administration (FDA) cleared or  approved and has been authorized by FDA under an Emergency Use  Authorization (EUA). The test is only authorized for the duration of  the declaration that circumstances exist justifying the authorization  of emergency use of in vitro diagnostic tests for detection and or  diagnosis of SARS CoV 2 under Section 564(b)(1) of the Act, 21 U.S.C.  section 313 177 7782 3(b)(1), unless the authorization is terminated or  revoked sooner. [facility] is certified under the  Clinical Laboratory Improvement Amendments of 1988 (CLIA), 42 U.S.C.  section [phone], to perform high complexity tests. Performed at [place] [id number], [place], [place] [address] Laboratory Director: Loleta Books, MD Fact Sheet for Healthcare Providers  BankingDealers.co.za Fact Sheet for Patients  StrictlyIdeas.no    Coronavirus Source NASOPHARYNGEAL  Final    Comment: Performed at [facility], [address] [place], [place] [address]    Radiology Reports Dg Chest 1 View  Result Date: 02/20/2019 CLINICAL DATA:  Evaluate endotracheal tube EXAM: CHEST  1 VIEW COMPARISON:  02/20/2019, 02/19/2019 FINDINGS: Endotracheal tube with the tip 5.3 cm above the carina. Nasogastric tube coursing below the diaphragm. Left jugular central venous catheter with the tip projecting  over the SVC. Bilateral peripheral interstitial and alveolar airspace opacities improved compared from x-ray performed earlier same day. No pleural effusion or pneumothorax. Stable cardiomegaly. No acute osseous abnormality. IMPRESSION: 1. Support lines and tubing in satisfactory position. 2. Bilateral interstitial and alveolar airspace opacities with improved aeration compared with the most recent prior examination performed earlier same day. Electronically Signed   By: Kathreen Devoid   On: 02/20/2019 19:32   Dg Chest Port 1 View  Result Date: 02/23/2019 CLINICAL DATA:  Encounter for shortness of breath. ARDS due to COVID-19 virus. EXAM: PORTABLE CHEST 1 VIEW COMPARISON:  One-view chest x-ray 02/22/2019 FINDINGS: Heart is mildly enlarged. Endotracheal tube terminates 3.8 cm above the carina. Left IJ line is stable. NG tube courses off the inferior border the film. Progressive interstitial and airspace disease is present at both lung bases.  Bilateral pleural effusions are present. IMPRESSION: 1. Increasing interstitial and airspace disease and both lung bases compatible with worsening ARDS. 2. Cardiomegaly and mild pulmonary vascular congestion. 3. Support apparatus is stable. Electronically Signed   By: San Morelle M.D.   On: 02/23/2019 07:46   Dg Chest Port 1 View  Result Date: 02/22/2019 CLINICAL DATA:  Acute hypoxemic respiratory failure. Endotracheal tube present. EXAM: PORTABLE CHEST 1 VIEW COMPARISON:  02/21/2019 FINDINGS: Endotracheal tube and left jugular central venous catheter remain in appropriate position. Nasogastric tube is been removed since prior exam. Patient is rotated to the right. Heart size is stable. Diffuse interstitial infiltrates show no significant change since previous study. Mild airspace opacity in the right lung base is also unchanged. No new or worsening areas of pulmonary opacity identified. IMPRESSION: No significant change in diffuse interstitial infiltrates and mild  right basilar airspace opacity. Electronically Signed   By: Earle Gell M.D.   On: 02/22/2019 08:55   Dg Chest Port 1 View  Result Date: 02/21/2019 CLINICAL DATA:  Acute respiratory failure with hypoxemia. EXAM: PORTABLE CHEST 1 VIEW COMPARISON:  02/20/2019 FINDINGS: Patient is rotated to the right. Endotracheal tube has tip 7 cm above the carina. Enteric tube courses into the region of the stomach and off the film as tip is not visualized. Left IJ central venous catheter unchanged. Lungs are adequately inflated. Mild hazy prominence of the infrahilar vasculature likely mild vascular congestion/edema. No effusion. Stable cardiomegaly. Remainder of the exam is unchanged. IMPRESSION: Mild stable cardiomegaly with suggestion mild vascular congestion. Tubes and lines as described. Electronically Signed   By: Marin Olp M.D.   On: 02/21/2019 16:31   Dg Chest Port 1 View  Result Date: 02/20/2019 CLINICAL DATA:  Respiratory failure EXAM: PORTABLE CHEST 1 VIEW COMPARISON:  02/19/2019 FINDINGS: Endotracheal tube, NG tube, left jugular central venous catheter are stable. Diffuse bilateral airspace opacities are stable. Normal heart size. No pneumothorax. IMPRESSION: Stable support apparatus and diffuse bilateral airspace disease. Electronically Signed   By: Marybelle Killings M.D.   On: 02/20/2019 07:26   Dg Chest Port 1 View  Result Date: 02/19/2019 CLINICAL DATA:  Respiratory failure.  COVID-19 EXAM: PORTABLE CHEST 1 VIEW COMPARISON:  02/18/2019 FINDINGS: Prone positioning. Endotracheal tube in good position. Left jugular catheter tip in the SVC. NG in the stomach. Cardiac enlargement. Diffuse bilateral airspace disease unchanged. No pneumothorax or effusion. IMPRESSION: Diffuse bilateral airspace disease unchanged. Support lines in good position and unchanged. Electronically Signed   By: Franchot Gallo M.D.   On: 02/19/2019 08:08   Dg Chest Port 1 View  Result Date: 02/18/2019 CLINICAL DATA:  Ventilator  dependent respiratory failure. Suspected COVID-19 infection. EXAM: PORTABLE CHEST 1 VIEW COMPARISON:  02/17/2019 and earlier. FINDINGS: Endotracheal tube tip in satisfactory position approximately 6 cm above the carina. RIGHT-sided central venous catheter crosses the midline with its tip likely in the LEFT innominate vein. Gastric tube difficult to visualize distally due to radiographic technique but likely courses below the diaphragm. Worsening airspace consolidation throughout the RIGHT lung and in the LEFT UPPER LOBE. Stable airspace consolidation in the LEFT LOWER LOBE. Stable cardiomegaly. Pulmonary venous hypertension without overt edema, unchanged. IMPRESSION: 1. Support apparatus satisfactory. 2. Worsening pneumonia throughout the RIGHT lung and in the LEFT UPPER LOBE. Stable LEFT LOWER LOBE pneumonia. 3. Stable cardiomegaly. Pulmonary venous hypertension without overt edema. Electronically Signed   By: Evangeline Dakin M.D.   On: 02/18/2019 08:03   Dg Chest [facility] 1 View  Result  Date: 02/17/2019 CLINICAL DATA:  Intubation EXAM: PORTABLE CHEST 1 VIEW COMPARISON:  02/17/2019 FINDINGS: endotracheal tube is 5 cm above the carina. NG tube enters the stomach. Cardiomegaly, vascular congestion. Bilateral interstitial prominence and lower lobe airspace opacities, likely edema. Small effusions suspected. No acute bony abnormality. IMPRESSION: Endotracheal tube 5 cm above the carina. Cardiomegaly with vascular congestion, interstitial prominence and lower lobe opacities, likely edema. Small layering effusions. Electronically Signed   By: Rolm Baptise M.D.   On: 02/17/2019 16:55   Dg Chest Port 1 View  Result Date: 02/17/2019 CLINICAL DATA:  Respiratory failure. Coronavirus positive patient. The image was taken prone. EXAM: PORTABLE CHEST 1 VIEW COMPARISON:  Single-view of the chest earlier today and 02/16/2019. FINDINGS: Support tubes and lines are unchanged. Bilateral airspace disease persists without change.  Heart size is appears enlarged. No pneumothorax or pleural effusion. IMPRESSION: No change in support apparatus or left worse than right airspace disease since the examination earlier today. Electronically Signed   By: Inge Rise M.D.   On: 02/17/2019 11:46   Dg Chest Port 1 View  Result Date: 02/17/2019 CLINICAL DATA:  Respiratory failure. Possible COVID-19. patient in prone position. EXAM: PORTABLE CHEST 1 VIEW COMPARISON:  02/16/2019 FINDINGS: Endotracheal tube tip is 5 cm above the carina. Orogastric or nasogastric tube enters the abdomen. Left internal jugular central line tip in the SVC at the azygos level. Bilateral patchy pulmonary infiltrates persist, similar allowing for technical differences. IMPRESSION: Persistent bilateral patchy pulmonary infiltrates. Lines and tubes satisfactory. Today's study is done prone. Electronically Signed   By: Nelson Chimes M.D.   On: 02/17/2019 06:20   Dg Chest Port 1 View  Result Date: 02/16/2019 CLINICAL DATA:  Central line placement. EXAM: PORTABLE CHEST 1 VIEW COMPARISON:  Radiograph earlier this day at 0530 hour FINDINGS: Tip of the left internal jugular central venous catheter projects over the brachiocephalic/SVC confluence. No pneumothorax. Endotracheal tube tip 6.3 cm from the carina and the thoracic inlet. Enteric tube in place, not well visualized distal to the lower esophagus. Again seen cardiomegaly. No significant change in mixed interstitial and airspace opacities, side for equivocal worsening at the left lung base. IMPRESSION: 1. Tip of the left internal jugular central venous catheter projects over the brachiocephalic/SVC confluence. No pneumothorax. 2. Unchanged cardiomegaly. Bilateral mixed interstitial and airspace opacities grossly stable, with equivocal worsening at the left lung base. Electronically Signed   By: Keith Rake M.D.   On: 02/16/2019 19:12   Dg Chest Port 1 View  Result Date: 02/16/2019 CLINICAL DATA:  68 year old female  with respiratory failure EXAM: PORTABLE CHEST 1 VIEW COMPARISON:  02/15/2019 FINDINGS: Cardiomediastinal silhouette unchanged in size and contour. Cardiomegaly. Endotracheal tube terminates 5.6 cm above the carina. Gastric tube terminates out of the field of view. Improving aeration of the bilateral lungs, with persisting mixed interstitial and airspace opacities and thickening of the minor fissure. No pneumothorax. No large pleural effusion. IMPRESSION: Persisting mixed interstitial and airspace opacities, with slight improved aeration from the prior plain film. Unchanged endotracheal tube and gastric tube Electronically Signed   By: Corrie Mckusick D.O.   On: 02/16/2019 08:09   Dg Chest Port 1 View  Result Date: 02/15/2019 CLINICAL DATA:  Intubation. EXAM: PORTABLE CHEST 1 VIEW COMPARISON:  Radiograph earlier this day at [place]: Endotracheal tube tip 5.1 cm from the carina. Enteric tube in place, tip likely below the diaphragm but not well visualized distal to the lower thorax. Progression in diffuse bilateral interstitial and  patchy airspace disease from exam earlier this day. Slight cardiomegaly compared to prior. Hazy opacity at the left lung base may be developing effusion or soft tissue attenuation. No pneumothorax. IMPRESSION: 1. Endotracheal tube tip 5.1 cm from the carina. Enteric tube in place, tip likely below the diaphragm but not well visualized in the lower thorax. 2. Progression in diffuse bilateral interstitial and patchy airspace disease from exam earlier this day, favoring multifocal pneumonia over pulmonary edema. Slight enlargement of the cardiac silhouette and possible developing left pleural effusion are new. Electronically Signed   By: Keith Rake M.D.   On: 02/15/2019 21:51

## 2019-02-23 NOTE — Progress Notes (Addendum)
Summary- Pt rec'd PRN hydralazine for elevated BP at 0754. Pt pulse and and anxiety level increased, versed administered at 0823. MD on floor at 1025, instruction rec'd to change vent to CPAP, decrease O2 to 30%, and titrate dilaudid gtt to 0.25 mg/hr. Pt pulse and BP elevated with these interventions, PRN beta blocker administered, effective. Pt rec'd diuretic at 1008, 530 cc clear yellow urine noted at 1100

## 2019-02-23 NOTE — Progress Notes (Signed)
Spoke with daughter, Arlyss Repress, updated her on mothers condition and plan of care. All questions answered. Will continue to monitor. Dicie Beam RN BSN.

## 2019-02-23 NOTE — Progress Notes (Signed)
While giving meds via OG tube, meds observed coming back out of her mouth.  OG tube appeared to have slipped slightly from original markings.  Tube then advanced. Auscultated gastric burp.

## 2019-02-23 NOTE — Progress Notes (Signed)
Nutrition Follow-up  DOCUMENTATION CODES:   Morbid obesity  INTERVENTION:   Change TF to a standard formula:  Osmolite 1.2 at 50 ml/h (1200 ml)  Pro-stat 60 ml BID  Provides 1840 kcal, 127 gm protein, 984 ml free water daily  NUTRITION DIAGNOSIS:   Inadequate oral intake related to inability to eat as evidenced by NPO status.  Ongoing  GOAL:   Provide needs based on ASPEN/SCCM guidelines  Met with TF  MONITOR:   Vent status, TF tolerance, Labs, Skin, I & O's  ASSESSMENT:   68 yo female with PMH of HLD and depression who was transferred from [facility] on 4/8 with acute respiratory failure requiring intubation. COVID-19 testing came back positive. Patient transferred to [facility] 4/14.  Diuresis ongoing. Propofol discontinued. Patient remains intubated on ventilator support. Temp (24hrs), Avg:100.2 F (37.9 C), Min:99.2 F (37.3 C), Max:100.6 F (38.1 C) MAP range 75-137 past 24 hours Propofol: off    Labs reviewed. CBG's: 175-190 Medications reviewed and include Lasix.   Currently receiving Vital High Protein at 40 ml/h with Pro-stat 30 ml BID providing 1260 kcal, 129 gm protein, 803 ml free water daily. Patient is stable for transition to a standard TF formula.   Diet Order:   Diet Order            Diet NPO time specified  Diet effective now              EDUCATION NEEDS:   No education needs have been identified at this time  Skin:  Skin Assessment: Skin Integrity Issues: Skin Integrity Issues:: Stage II Stage II: nose  Last BM:  4/16 (type 7)  Height:   Ht Readings from Last 1 Encounters:  02/17/19 '5\' 4"'$  (1.626 m)    Weight:   Wt Readings from Last 1 Encounters:  02/23/19 135 kg    Ideal Body Weight:  54.5 kg  BMI:  Body mass index is 51.1 kg/m.  Estimated Nutritional Needs:   Kcal:  1450-1850  Protein:  120-136 gm  Fluid:  2 L    Molli Barrows, RD, LDN, CNSC Pager [phone] After Hours Pager [phone]

## 2019-02-24 ENCOUNTER — Inpatient Hospital Stay (HOSPITAL_COMMUNITY): Payer: Medicare Other

## 2019-02-24 LAB — URINALYSIS, ROUTINE W REFLEX MICROSCOPIC
Bilirubin Urine: NEGATIVE
Glucose, UA: NEGATIVE mg/dL
Ketones, ur: NEGATIVE mg/dL
Nitrite: NEGATIVE
Protein, ur: NEGATIVE mg/dL
Specific Gravity, Urine: 1.019 (ref 1.005–1.030)
pH: 6 (ref 5.0–8.0)

## 2019-02-24 LAB — POCT I-STAT 7, (LYTES, BLD GAS, ICA,H+H)
Acid-Base Excess: 14 mmol/L — ABNORMAL HIGH (ref 0.0–2.0)
Bicarbonate: 39.3 mmol/L — ABNORMAL HIGH (ref 20.0–28.0)
Calcium, Ion: 1.17 mmol/L (ref 1.15–1.40)
HCT: 37 % (ref 36.0–46.0)
Hemoglobin: 12.6 g/dL (ref 12.0–15.0)
O2 Saturation: 92 %
Patient temperature: 98.6
Potassium: 3 mmol/L — ABNORMAL LOW (ref 3.5–5.1)
Sodium: 147 mmol/L — ABNORMAL HIGH (ref 135–145)
TCO2: 41 mmol/L — ABNORMAL HIGH (ref 22–32)
pCO2 arterial: 52.7 mmHg — ABNORMAL HIGH (ref 32.0–48.0)
pH, Arterial: 7.48 — ABNORMAL HIGH (ref 7.350–7.450)
pO2, Arterial: 62 mmHg — ABNORMAL LOW (ref 83.0–108.0)

## 2019-02-24 LAB — CBC
HCT: 41.6 % (ref 36.0–46.0)
Hemoglobin: 12.7 g/dL (ref 12.0–15.0)
MCH: 30.2 pg (ref 26.0–34.0)
MCHC: 30.5 g/dL (ref 30.0–36.0)
MCV: 98.8 fL (ref 80.0–100.0)
Platelets: 254 10*3/uL (ref 150–400)
RBC: 4.21 MIL/uL (ref 3.87–5.11)
RDW: 14.6 % (ref 11.5–15.5)
WBC: 4.6 10*3/uL (ref 4.0–10.5)
nRBC: 0 % (ref 0.0–0.2)

## 2019-02-24 LAB — BASIC METABOLIC PANEL
Anion gap: 8 (ref 5–15)
BUN: 39 mg/dL — ABNORMAL HIGH (ref 8–23)
CO2: 35 mmol/L — ABNORMAL HIGH (ref 22–32)
Calcium: 9 mg/dL (ref 8.9–10.3)
Chloride: 102 mmol/L (ref 98–111)
Creatinine, Ser: 0.65 mg/dL (ref 0.44–1.00)
GFR calc Af Amer: >60 mL/min (ref >60)
GFR calc non Af Amer: >60 mL/min (ref >60)
Glucose, Bld: 158 mg/dL — ABNORMAL HIGH (ref 70–99)
Potassium: 2.8 mmol/L — ABNORMAL LOW (ref 3.5–5.1)
Sodium: 145 mmol/L (ref 135–145)

## 2019-02-24 LAB — BRAIN NATRIURETIC PEPTIDE: B Natriuretic Peptide: 36.5 pg/mL (ref 0.0–100.0)

## 2019-02-24 LAB — GLUCOSE, CAPILLARY
Glucose-Capillary: 148 mg/dL — ABNORMAL HIGH (ref 70–99)
Glucose-Capillary: 179 mg/dL — ABNORMAL HIGH (ref 70–99)
Glucose-Capillary: 174 mg/dL — ABNORMAL HIGH (ref 70–99)
Glucose-Capillary: 201 mg/dL — ABNORMAL HIGH (ref 70–99)
Glucose-Capillary: 237 mg/dL — ABNORMAL HIGH (ref 70–99)
Glucose-Capillary: 199 mg/dL — ABNORMAL HIGH (ref 70–99)

## 2019-02-24 MED ORDER — ACETAMINOPHEN 325 MG PO TABS
650.0000 mg | ORAL_TABLET | Freq: Four times a day (QID) | ORAL | Status: DC | PRN
  Administered 2019-02-24 – 2019-02-25 (×3): 650 mg
  Filled 2019-02-24 (×3): qty 2

## 2019-02-24 MED ORDER — POTASSIUM CHLORIDE 10 MEQ/100ML IV SOLN
10.0000 meq | INTRAVENOUS | Status: AC
  Administered 2019-02-24 (×3): 10 meq via INTRAVENOUS
  Filled 2019-02-24 (×3): qty 100

## 2019-02-24 MED ORDER — POTASSIUM CHLORIDE 20 MEQ/15ML (10%) PO SOLN
40.0000 meq | Freq: Every day | ORAL | Status: DC
  Administered 2019-02-24: 17:00:00 40 meq
  Filled 2019-02-24: qty 30

## 2019-02-24 MED ORDER — FUROSEMIDE 10 MG/ML IJ SOLN
40.0000 mg | Freq: Two times a day (BID) | INTRAMUSCULAR | Status: AC
  Administered 2019-02-24 (×2): 40 mg via INTRAVENOUS
  Filled 2019-02-24 (×2): qty 4

## 2019-02-24 MED ORDER — DEXAMETHASONE SODIUM PHOSPHATE 4 MG/ML IJ SOLN
4.0000 mg | Freq: Four times a day (QID) | INTRAMUSCULAR | Status: AC
  Administered 2019-02-24 – 2019-02-25 (×4): 4 mg via INTRAVENOUS
  Filled 2019-02-24 (×4): qty 1

## 2019-02-24 NOTE — Progress Notes (Signed)
Temp >101 F Blood/Urine cx ordered Follow fever curve-will start Abx will persistently febrile.

## 2019-02-24 NOTE — TOC Transition Note (Signed)
Transition of Care [facility]) - CM/SW Discharge Note   Patient Details  Name: Sherry Becker MRN: [id number] Date of Birth: Apr 14, 1951  Transition of [facility] CM/SW Contact:  Durenda Guthrie, RN Phone Number: 02/24/2019, 3:05 PM   Clinical Narrative:   68 y.o. female who was admitted 4/8 after transfer from [facility] with acute hypoxic respiratory failure requiring intubation in the setting of bilateral infiltrates.  COVID positive.     Patient remains intubated and on Vent.           Patient Goals and CMS Choice        Discharge Placement                       Discharge Plan and Services    Case manager will continue to monitor for disposition as medical condition improves. May God bless her to recover.                          Social Determinants of Health (SDOH) Interventions     Readmission Risk Interventions No flowsheet data found.

## 2019-02-24 NOTE — Progress Notes (Addendum)
PROGRESS NOTE                                                                                                                                                                                                             Patient Demographics:    Sherry Becker, is a 68 y.o. female, DOB - Dec 10, 1950, WLS:[id number]  Outpatient Primary MD for the patient is Patient, No Pcp Per    LOS - 9    Note-today's round was via telemedicince-PCCM MD has physically rounded on the patient   Brief Narrative: Patient is a 68 y.o. female with PMHx of depression, probable OSA-who presented from [facility] as a transfer on 4/8 with acute hypoxic respiratory failure secondary to COVID 19 viral pneumonia.   Significant events: 4/08 Admit, intubated. 4/8>>4/13 Plaquenil 4/9 Actemra 4/09 Prone  4/10 LCB 4/13 Prone 0230. Remains on ketamine, fentanyl, propofol gtt's >transition to dilaudid gtt 4/14 Transfer to [facility] 4/14 Worsening Hypoxia/near resp arrest due to mucus plugging/proned   Subjective:    [name] today was seen via telemedicine-awake on the ventilator   Assessment  & Plan :   Acute hypoxic respiratory failure secondary to ARDS due to COVID-19 viral pneumonia: Continue full ventilator support-underwent proning 4/14.  Slowly improving with-remains high presently with supportive FiO2 requirements have started coming. Repeat IV Lasix today-attempt to keep in neg balance. Per PCCM no cuff leak-start empiric Decadron x 4 doses in case patient has cord edema. Patient is s/p Actemra on 4/9-and completed Plaquenil.  Blood cultures/Respiratory virus panel negative.  PCCM following.  Hypokalemia: Repleted-we will recheck tomorrow.    Hypotension: Resolved-possibly related to sedative agents.  Bradycardia: Resolved-thought to be related to sedative agents  Depression/anxiety: Continue Klonopin and Seroquel,Cymbalta on hold-we  will resume when able  Morbid obesity  Vent Settings: Vent Mode: PSV;CPAP FiO2 (%):  [30 %-60 %] 40 % Set Rate:  [30 bmp] 30 bmp Vt Set:  [400 mL] 400 mL PEEP:  [5 cmH20-10 cmH20] 5 cmH20 Pressure Support:  [5 cmH20-10 cmH20] 10 cmH20 Plateau Pressure:  [13 cmH20-23 cmH20] 23 cmH20  ABG:    Component Value Date/Time   PHART 7.480 (H) 02/24/2019 0315   PCO2ART 52.7 (H) 02/24/2019 0315   PO2ART 62.0 (L) 02/24/2019 0315   HCO3 39.3 (H) 02/24/2019 0315   TCO2 41 (H) 02/24/2019 0315  O2SAT 92.0 02/24/2019 0315    Condition - Extremely Guarded  Family Communication  : Daughter updated over the phone on 4/16  Code Status : Partial code  Diet : NPO-NG feeds in place  Disposition Plan  :  Remain in the ICU  Consults  :  PCCM  Procedures  :   ETT 4/8>> CVC/left IJ 4/9>>  GI prophylaxis: PPI  DVT Prophylaxis  :  Lovenox  Lab Results  Component Value Date   PLT 254 02/24/2019    Inpatient Medications  Scheduled Meds:  chlorhexidine gluconate (MEDLINE KIT)  15 mL Mouth Rinse BID   Chlorhexidine Gluconate Cloth  6 each Topical Daily   clonazePAM  1 mg Per Tube BID   enoxaparin (LOVENOX) injection  60 mg Subcutaneous Q24H   feeding supplement (PRO-STAT SUGAR FREE 64)  60 mL Per Tube BID   furosemide  40 mg Intravenous Q12H   guaiFENesin  15 mL Per Tube Q6H   insulin aspart  4 Units Subcutaneous Q4H   mouth rinse  15 mL Mouth Rinse 10 times per day   oxyCODONE  10 mg Per Tube Q4H   pantoprazole sodium  40 mg Per Tube Q24H   polyethylene glycol  17 g Oral Daily   potassium chloride  40 mEq Per Tube Daily   QUEtiapine  50 mg Per Tube BID   Continuous Infusions:  sodium chloride 10 mL/hr at 02/21/19 1641   feeding supplement (OSMOLITE 1.2 CAL) Stopped (02/23/19 1700)   HYDROmorphone 0.5 mg/hr (02/24/19 0914)   potassium chloride 10 mEq (02/24/19 0914)   propofol (DIPRIVAN) infusion Stopped (02/22/19 0957)   PRN Meds:.Place/Maintain arterial  line **AND** sodium chloride, bisacodyl, hydrALAZINE, HYDROmorphone, ipratropium-albuterol, labetalol, midazolam  Antibiotics  :    Anti-infectives (From admission, onward)   Start     Dose/Rate Route Frequency Ordered Stop   02/16/19 2200  hydroxychloroquine (PLAQUENIL) tablet 200 mg     200 mg Per Tube 2 times daily 02/15/19 2051 02/20/19 0908   02/15/19 2200  hydroxychloroquine (PLAQUENIL) tablet 400 mg     400 mg Per Tube 2 times daily 02/15/19 2051 02/16/19 2159   02/15/19 2130  azithromycin (ZITHROMAX) 500 mg in sodium chloride 0.9 % 250 mL IVPB  Status:  Discontinued     500 mg 250 mL/hr over 60 Minutes Intravenous Daily at bedtime 02/15/19 2051 02/20/19 0923   02/15/19 2130  cefTRIAXone (ROCEPHIN) 2 g in sodium chloride 0.9 % 100 mL IVPB  Status:  Discontinued     2 g 200 mL/hr over 30 Minutes Intravenous Daily at bedtime 02/15/19 2057 02/20/19 0923   02/15/19 2100  cefTRIAXone (ROCEPHIN) injection 1 g  Status:  Discontinued     1 g Intramuscular Every 24 hours 02/15/19 2051 02/15/19 2057       Time Spent in minutes  35 min  Oren Binet M.D on 02/24/2019 at 9:34 AM  To page go to [email or web address] - password [email or web address]  [facility] -  Office  [phone]  See all Orders from today for further details   Admit date - 02/15/2019      Objective:   Vitals:   02/24/19 0500 02/24/19 0715 02/24/19 0800 02/24/19 0824  BP: 119/60 132/67 (!) 167/88   Pulse: 68 77 79   Resp: (!) 30 14 (!) 30   Temp:   99.8 F (37.7 C)   TempSrc:   Axillary   SpO2: (!) 88% 91% 96% 98%  Weight:      Height:  Wt Readings from Last 3 Encounters:  02/24/19 133.8 kg     Intake/Output Summary (Last 24 hours) at 02/24/2019 0934 Last data filed at 02/24/2019 0914 Gross per 24 hour  Intake 1868.9 ml  Output 2645 ml  Net -776.1 ml     Physical Exam Per PCCM   Data Review:    CBC Recent Labs  Lab 02/18/19 0427  02/19/19 0424  02/20/19 0345  02/21/19 0339  02/23/19 0420  02/23/19 0515 02/23/19 1155 02/24/19 0315 02/24/19 0435  WBC 3.0*  --  4.0  --  5.3  --  4.7  --  5.2  --   --   --  4.6  HGB 12.0   < > 12.7   < > 12.7   < > 10.2*   < > 13.1 13.9 13.6 12.6 12.7  HCT 41.1   < > 43.7   < > 43.3   < > 34.9*   < > 43.8 41.0 40.0 37.0 41.6  PLT 177  --  192  --  211  --  204  --  267  --   --   --  254  MCV 99.8  --  99.1  --  99.1  --  100.6*  --  99.1  --   --   --  98.8  MCH 29.1  --  28.8  --  29.1  --  29.4  --  29.6  --   --   --  30.2  MCHC 29.2*  --  29.1*  --  29.3*  --  29.2*  --  29.9*  --   --   --  30.5  RDW 15.0  --  14.5  --  14.3  --  14.4  --  14.2  --   --   --  14.6  LYMPHSABS 0.4*  --  0.9  --  1.1  --   --   --   --   --   --   --   --   MONOABS 0.1  --  0.3  --  0.3  --   --   --   --   --   --   --   --   EOSABS 0.0  --  0.0  --  0.1  --   --   --   --   --   --   --   --   BASOSABS 0.0  --  0.0  --  0.0  --   --   --   --   --   --   --   --    < > = values in this interval not displayed.    Chemistries  Recent Labs  Lab 02/18/19 0427  02/19/19 0424  02/20/19 0345  02/21/19 0339  02/21/19 0530  02/22/19 0500  02/23/19 0420 02/23/19 0515 02/23/19 1155 02/24/19 0315 02/24/19 0435  NA 144   < > 145   < > 145   < > 145   < >  --    < > 144   < > 144 144 145 147* 145  K 3.6   < > 3.9   < > 3.9   < > 2.7*   < >  --    < > 3.4*   < > 3.8 3.9 3.8 3.0* 2.8*  CL 100  --  100  --  96*  --  110  --   --   --  100  --  102  --   --   --  102  CO2 31  --  33*  --  36*  --  27  --   --   --  33*  --  33*  --   --   --  35*  GLUCOSE 101*  --  115*  --  113*  --  90  --   --   --  118*  --  168*  --   --   --  158*  BUN 11  --  8  --  12  --  10  --   --   --  19  --  28*  --   --   --  39*  CREATININE 0.82  --  0.74  --  0.96  --  0.73  --   --   --  0.77  --  0.68  --   --   --  0.65  CALCIUM 7.6*  --  7.9*  --  8.6*  --  6.8*  --   --   --  8.7*  --  8.9  --   --   --  9.0  MG 1.9  --  1.9  --  1.9  --   --   --  2.0  --  2.3  --   --    --   --   --   --   AST 24  --  31  --   --   --   --   --   --   --   --   --  27  --   --   --   --   ALT 19  --  25  --   --   --   --   --   --   --   --   --  32  --   --   --   --   ALKPHOS 95  --  101  --   --   --   --   --   --   --   --   --  108  --   --   --   --   BILITOT 0.7  --  1.0  --   --   --   --   --   --   --   --   --  0.6  --   --   --   --    < > = values in this interval not displayed.   ------------------------------------------------------------------------------------------------------------------ Recent Labs    02/22/19 1645  TRIG 330*    No results found for: HGBA1C ------------------------------------------------------------------------------------------------------------------ No results for input(s): TSH, T4TOTAL, T3FREE, THYROIDAB in the last 72 hours.  Invalid input(s): FREET3 ------------------------------------------------------------------------------------------------------------------ No results for input(s): VITAMINB12, FOLATE, FERRITIN, TIBC, IRON, RETICCTPCT in the last 72 hours.  Coagulation profile No results for input(s): INR, PROTIME in the last 168 hours.  No results for input(s): DDIMER in the last 72 hours.  Cardiac Enzymes No results for input(s): CKMB, TROPONINI, MYOGLOBIN in the last 168 hours.  Invalid input(s): CK ------------------------------------------------------------------------------------------------------------------    Component Value Date/Time   BNP 36.5 02/24/2019 0435    Micro Results Recent Results (from the past 240 hour(s))  MRSA PCR Screening     Status: None   Collection Time: 02/15/19  8:56 PM  Result Value Ref Range  Status   MRSA by PCR NEGATIVE NEGATIVE Final    Comment:        The GeneXpert MRSA Assay (FDA approved for NASAL specimens only), is one component of a comprehensive MRSA colonization surveillance program. It is not intended to diagnose MRSA infection nor to guide or monitor  treatment for MRSA infections. Performed at [facility], [place] [address] [place], [place] [address]   Culture, blood (routine x 2)     Status: None   Collection Time: 02/15/19  9:46 PM  Result Value Ref Range Status   Specimen Description BLOOD LEFT FOREARM  Final   Special Requests   Final    BOTTLES DRAWN AEROBIC ONLY Blood Culture adequate volume   Culture   Final    NO GROWTH 5 DAYS Performed at [facility], [place] [address] [place], [place] [address]    Report Status 02/20/2019 FINAL  Final  Urine culture     Status: None   Collection Time: 02/15/19  9:55 PM  Result Value Ref Range Status   Specimen Description URINE, RANDOM  Final   Special Requests NONE  Final   Culture   Final    NO GROWTH Performed at [facility], [place] [address], [place], [place] [address]    Report Status 02/16/2019 FINAL  Final  Culture, blood (routine x 2)     Status: None   Collection Time: 02/15/19  9:56 PM  Result Value Ref Range Status   Specimen Description BLOOD LEFT HAND  Final   Special Requests   Final    BOTTLES DRAWN AEROBIC AND ANAEROBIC Blood Culture adequate volume   Culture   Final    NO GROWTH 5 DAYS Performed at [facility], [place] [address] [place], [place] [address]    Report Status 02/20/2019 FINAL  Final  Culture, respiratory (tracheal aspirate)     Status: None   Collection Time: 02/15/19 11:51 PM  Result Value Ref Range Status   Specimen Description TRACHEAL ASPIRATE  Final   Special Requests NONE  Final   Gram Stain   Final    MODERATE WBC PRESENT, PREDOMINANTLY MONONUCLEAR FEW GRAM POSITIVE RODS FEW GRAM POSITIVE COCCI    Culture   Final    RARE Consistent with normal respiratory flora. Performed at [facility], [place] [address], [place], [place] [address]    Report Status 02/18/2019 FINAL  Final  Respiratory Panel by PCR     Status: None   Collection Time: 02/16/19  4:02 AM  Result Value Ref Range Status   Adenovirus NOT  DETECTED NOT DETECTED Final   Coronavirus 229E NOT DETECTED NOT DETECTED Final    Comment: (NOTE) The Coronavirus on the Respiratory Panel, DOES NOT test for the novel  Coronavirus (2019 nCoV)    Coronavirus HKU1 NOT DETECTED NOT DETECTED Final   Coronavirus NL63 NOT DETECTED NOT DETECTED Final   Coronavirus OC43 NOT DETECTED NOT DETECTED Final   Metapneumovirus NOT DETECTED NOT DETECTED Final   Rhinovirus / Enterovirus NOT DETECTED NOT DETECTED Final   Influenza A NOT DETECTED NOT DETECTED Final   Influenza B NOT DETECTED NOT DETECTED Final   Parainfluenza Virus 1 NOT DETECTED NOT DETECTED Final   Parainfluenza Virus 2 NOT DETECTED NOT DETECTED Final   Parainfluenza Virus 3 NOT DETECTED NOT DETECTED Final   Parainfluenza Virus 4 NOT DETECTED NOT DETECTED Final   Respiratory Syncytial Virus NOT DETECTED NOT DETECTED Final  Bordetella pertussis NOT DETECTED NOT DETECTED Final   Chlamydophila pneumoniae NOT DETECTED NOT DETECTED Final   Mycoplasma pneumoniae NOT DETECTED NOT DETECTED Final    Comment: Performed at [facility], [place] [address], [place], [place] [address]  Novel Coronavirus, NAA (hospital order; send-out to ref lab)     Status: Abnormal   Collection Time: 02/16/19  2:49 PM  Result Value Ref Range Status   SARS-CoV-2, NAA DETECTED (A) NOT DETECTED Final    Comment: Positive (Detected) results are indicative of active infection with SARS CoV 2. A positive result does not rule out bacterial infection or coinfection with other viruses. Detection of SARS CoV 2 viral RNA may not indicate that SARS CoV 2 is the causative agent for clinical symptoms. Positive and negative predictive values of testing are highly dependent on prevalence. False positive test results are more likely when prevalence is moderate to low. RESULT CALLED TO, READ BACK BY AND VERIFIED WITH: NBeckie Salts 0121 02/20/2019 T. TYSOR (NOTE) The expected result is Negative (Not Detected). The SARS CoV 2  test is intended for the presumptive qualitative  detection of nucleic acid from SARS CoV 2 in upper and lower  respiratory specimens. Testing methodology is real time RT PCR. Test results must be correlated with clinical presentation and  evaluated in the context of other laboratory and epidemiologic data.  Test performance can be affected because the epidemiology and  cli nical spectrum of infection caused by SARS CoV 2 is not fully  known. For example, the optimum types of specimens to collect and  when during the course of infection these specimens are most likely  to contain detectable viral RNA may not be known. This test has not been Food and Drug Administration (FDA) cleared or  approved and has been authorized by FDA under an Emergency Use  Authorization (EUA). The test is only authorized for the duration of  the declaration that circumstances exist justifying the authorization  of emergency use of in vitro diagnostic tests for detection and or  diagnosis of SARS CoV 2 under Section 564(b)(1) of the Act, 21 U.S.C.  section [phone] 3(b)(1), unless the authorization is terminated or  revoked sooner. [facility] is certified under the  Clinical Laboratory Improvement Amendments of 1988 (CLIA), 42 U.S.C.  section [phone], to perform high complexity tests. Performed at [id number] [address], [place], [place] [address] Laboratory Director: Loleta Books, MD Fact Sheet for Healthcare Providers  BankingDealers.co.za Fact Sheet for Patients  StrictlyIdeas.no    Coronavirus Source NASOPHARYNGEAL  Final    Comment: Performed at [facility], [address], [place], [place] [address]    Radiology Reports Dg Chest 1 View  Result Date: 02/20/2019 CLINICAL DATA:  Evaluate endotracheal tube EXAM: CHEST  1 VIEW COMPARISON:  02/20/2019, 02/19/2019 FINDINGS:  Endotracheal tube with the tip 5.3 cm above the carina. Nasogastric tube coursing below the diaphragm. Left jugular central venous catheter with the tip projecting over the SVC. Bilateral peripheral interstitial and alveolar airspace opacities improved compared from x-ray performed earlier same day. No pleural effusion or pneumothorax. Stable cardiomegaly. No acute osseous abnormality. IMPRESSION: 1. Support lines and tubing in satisfactory position. 2. Bilateral interstitial and alveolar airspace opacities with improved aeration compared with the most recent prior examination performed earlier same day. Electronically Signed   By: Kathreen Devoid   On: 02/20/2019 19:32   Dg Chest Port 1  View  Result Date: 02/24/2019 CLINICAL DATA:  COVID-19 pneumonia.  Shortness of breath. EXAM: PORTABLE CHEST 1 VIEW COMPARISON:  One-view chest x-ray 02/23/2019 FINDINGS: Heart size is exaggerated by low lung volumes. Endotracheal tube terminates 4 cm from the carina. Left IJ line is stable. Aeration of both lung bases is slightly improved. IMPRESSION: 1. Slight improved aeration at both lung bases. 2. Support apparatus is stable. Electronically Signed   By: San Morelle M.D.   On: 02/24/2019 07:02   Dg Chest Port 1 View  Result Date: 02/23/2019 CLINICAL DATA:  Encounter for shortness of breath. ARDS due to COVID-19 virus. EXAM: PORTABLE CHEST 1 VIEW COMPARISON:  One-view chest x-ray 02/22/2019 FINDINGS: Heart is mildly enlarged. Endotracheal tube terminates 3.8 cm above the carina. Left IJ line is stable. NG tube courses off the inferior border the film. Progressive interstitial and airspace disease is present at both lung bases. Bilateral pleural effusions are present. IMPRESSION: 1. Increasing interstitial and airspace disease and both lung bases compatible with worsening ARDS. 2. Cardiomegaly and mild pulmonary vascular congestion. 3. Support apparatus is stable. Electronically Signed   By: San Morelle M.D.    On: 02/23/2019 07:46   Dg Chest Port 1 View  Result Date: 02/22/2019 CLINICAL DATA:  Acute hypoxemic respiratory failure. Endotracheal tube present. EXAM: PORTABLE CHEST 1 VIEW COMPARISON:  02/21/2019 FINDINGS: Endotracheal tube and left jugular central venous catheter remain in appropriate position. Nasogastric tube is been removed since prior exam. Patient is rotated to the right. Heart size is stable. Diffuse interstitial infiltrates show no significant change since previous study. Mild airspace opacity in the right lung base is also unchanged. No new or worsening areas of pulmonary opacity identified. IMPRESSION: No significant change in diffuse interstitial infiltrates and mild right basilar airspace opacity. Electronically Signed   By: Earle Gell M.D.   On: 02/22/2019 08:55   Dg Chest Port 1 View  Result Date: 02/21/2019 CLINICAL DATA:  Acute respiratory failure with hypoxemia. EXAM: PORTABLE CHEST 1 VIEW COMPARISON:  02/20/2019 FINDINGS: Patient is rotated to the right. Endotracheal tube has tip 7 cm above the carina. Enteric tube courses into the region of the stomach and off the film as tip is not visualized. Left IJ central venous catheter unchanged. Lungs are adequately inflated. Mild hazy prominence of the infrahilar vasculature likely mild vascular congestion/edema. No effusion. Stable cardiomegaly. Remainder of the exam is unchanged. IMPRESSION: Mild stable cardiomegaly with suggestion mild vascular congestion. Tubes and lines as described. Electronically Signed   By: Marin Olp M.D.   On: 02/21/2019 16:31   Dg Chest Port 1 View  Result Date: 02/20/2019 CLINICAL DATA:  Respiratory failure EXAM: PORTABLE CHEST 1 VIEW COMPARISON:  02/19/2019 FINDINGS: Endotracheal tube, NG tube, left jugular central venous catheter are stable. Diffuse bilateral airspace opacities are stable. Normal heart size. No pneumothorax. IMPRESSION: Stable support apparatus and diffuse bilateral airspace disease.  Electronically Signed   By: Marybelle Killings M.D.   On: 02/20/2019 07:26   Dg Chest Port 1 View  Result Date: 02/19/2019 CLINICAL DATA:  Respiratory failure.  COVID-19 EXAM: PORTABLE CHEST 1 VIEW COMPARISON:  02/18/2019 FINDINGS: Prone positioning. Endotracheal tube in good position. Left jugular catheter tip in the SVC. NG in the stomach. Cardiac enlargement. Diffuse bilateral airspace disease unchanged. No pneumothorax or effusion. IMPRESSION: Diffuse bilateral airspace disease unchanged. Support lines in good position and unchanged. Electronically Signed   By: Franchot Gallo M.D.   On: 02/19/2019 08:08   Dg Chest [facility]  1 View  Result Date: 02/18/2019 CLINICAL DATA:  Ventilator dependent respiratory failure. Suspected COVID-19 infection. EXAM: PORTABLE CHEST 1 VIEW COMPARISON:  02/17/2019 and earlier. FINDINGS: Endotracheal tube tip in satisfactory position approximately 6 cm above the carina. RIGHT-sided central venous catheter crosses the midline with its tip likely in the LEFT innominate vein. Gastric tube difficult to visualize distally due to radiographic technique but likely courses below the diaphragm. Worsening airspace consolidation throughout the RIGHT lung and in the LEFT UPPER LOBE. Stable airspace consolidation in the LEFT LOWER LOBE. Stable cardiomegaly. Pulmonary venous hypertension without overt edema, unchanged. IMPRESSION: 1. Support apparatus satisfactory. 2. Worsening pneumonia throughout the RIGHT lung and in the LEFT UPPER LOBE. Stable LEFT LOWER LOBE pneumonia. 3. Stable cardiomegaly. Pulmonary venous hypertension without overt edema. Electronically Signed   By: Evangeline Dakin M.D.   On: 02/18/2019 08:03   Dg Chest Port 1 View  Result Date: 02/17/2019 CLINICAL DATA:  Intubation EXAM: PORTABLE CHEST 1 VIEW COMPARISON:  02/17/2019 FINDINGS: endotracheal tube is 5 cm above the carina. NG tube enters the stomach. Cardiomegaly, vascular congestion. Bilateral interstitial prominence and  lower lobe airspace opacities, likely edema. Small effusions suspected. No acute bony abnormality. IMPRESSION: Endotracheal tube 5 cm above the carina. Cardiomegaly with vascular congestion, interstitial prominence and lower lobe opacities, likely edema. Small layering effusions. Electronically Signed   By: Rolm Baptise M.D.   On: 02/17/2019 16:55   Dg Chest Port 1 View  Result Date: 02/17/2019 CLINICAL DATA:  Respiratory failure. Coronavirus positive patient. The image was taken prone. EXAM: PORTABLE CHEST 1 VIEW COMPARISON:  Single-view of the chest earlier today and 02/16/2019. FINDINGS: Support tubes and lines are unchanged. Bilateral airspace disease persists without change. Heart size is appears enlarged. No pneumothorax or pleural effusion. IMPRESSION: No change in support apparatus or left worse than right airspace disease since the examination earlier today. Electronically Signed   By: Inge Rise M.D.   On: 02/17/2019 11:46   Dg Chest Port 1 View  Result Date: 02/17/2019 CLINICAL DATA:  Respiratory failure. Possible COVID-19. patient in prone position. EXAM: PORTABLE CHEST 1 VIEW COMPARISON:  02/16/2019 FINDINGS: Endotracheal tube tip is 5 cm above the carina. Orogastric or nasogastric tube enters the abdomen. Left internal jugular central line tip in the SVC at the azygos level. Bilateral patchy pulmonary infiltrates persist, similar allowing for technical differences. IMPRESSION: Persistent bilateral patchy pulmonary infiltrates. Lines and tubes satisfactory. Today's study is done prone. Electronically Signed   By: Nelson Chimes M.D.   On: 02/17/2019 06:20   Dg Chest Port 1 View  Result Date: 02/16/2019 CLINICAL DATA:  Central line placement. EXAM: PORTABLE CHEST 1 VIEW COMPARISON:  Radiograph earlier this day at 0530 hour FINDINGS: Tip of the left internal jugular central venous catheter projects over the brachiocephalic/SVC confluence. No pneumothorax. Endotracheal tube tip 6.3 cm from  the carina and the thoracic inlet. Enteric tube in place, not well visualized distal to the lower esophagus. Again seen cardiomegaly. No significant change in mixed interstitial and airspace opacities, side for equivocal worsening at the left lung base. IMPRESSION: 1. Tip of the left internal jugular central venous catheter projects over the brachiocephalic/SVC confluence. No pneumothorax. 2. Unchanged cardiomegaly. Bilateral mixed interstitial and airspace opacities grossly stable, with equivocal worsening at the left lung base. Electronically Signed   By: Keith Rake M.D.   On: 02/16/2019 19:12   Dg Chest Port 1 View  Result Date: 02/16/2019 CLINICAL DATA:  68 year old female with respiratory failure EXAM:  PORTABLE CHEST 1 VIEW COMPARISON:  02/15/2019 FINDINGS: Cardiomediastinal silhouette unchanged in size and contour. Cardiomegaly. Endotracheal tube terminates 5.6 cm above the carina. Gastric tube terminates out of the field of view. Improving aeration of the bilateral lungs, with persisting mixed interstitial and airspace opacities and thickening of the minor fissure. No pneumothorax. No large pleural effusion. IMPRESSION: Persisting mixed interstitial and airspace opacities, with slight improved aeration from the prior plain film. Unchanged endotracheal tube and gastric tube Electronically Signed   By: Corrie Mckusick D.O.   On: 02/16/2019 08:09   Dg Chest Port 1 View  Result Date: 02/15/2019 CLINICAL DATA:  Intubation. EXAM: PORTABLE CHEST 1 VIEW COMPARISON:  Radiograph earlier this day at [place]: Endotracheal tube tip 5.1 cm from the carina. Enteric tube in place, tip likely below the diaphragm but not well visualized distal to the lower thorax. Progression in diffuse bilateral interstitial and patchy airspace disease from exam earlier this day. Slight cardiomegaly compared to prior. Hazy opacity at the left lung base may be developing effusion or soft tissue attenuation. No  pneumothorax. IMPRESSION: 1. Endotracheal tube tip 5.1 cm from the carina. Enteric tube in place, tip likely below the diaphragm but not well visualized in the lower thorax. 2. Progression in diffuse bilateral interstitial and patchy airspace disease from exam earlier this day, favoring multifocal pneumonia over pulmonary edema. Slight enlargement of the cardiac silhouette and possible developing left pleural effusion are new. Electronically Signed   By: Keith Rake M.D.   On: 02/15/2019 21:51

## 2019-02-24 NOTE — Progress Notes (Signed)
NAME:  Sherry Becker, MRN:  [id number], DOB:  13-Jun-1951, LOS: 9 ADMISSION DATE:  02/15/2019, CONSULTATION DATE:  02/15/19 REFERRING MD:  [facility]  CHIEF COMPLAINT:  SOB   Brief History   Sherry Becker is a 68 y.o. female who was admitted 4/8 after transfer from [facility] with acute hypoxic respiratory failure requiring intubation in the setting of bilateral infiltrates.  COVID positive.      Past Medical History  No definitive history but per med list from [place], suspect combo of depression and mood disorder along with HLD.  Probable OSA.  Significant Hospital Events   4/08 Admit, intubated. 4/09 Prone  4/10 LCB 4/13 Prone 0230. Remains on ketamine, fentanyl, propofol gtt's >transition to dilaudid gtt  Consults:     Procedures:  ETT 4/8 >>  CVC 4/9 >> L Rad A Line 4/9 >>   Significant Diagnostic Tests:  CXR 4/8 >> extensive bilateral pulmonary infiltrates. LE duplex 4/8 >> not done due to airborne precautions >>  Micro Data:  Blood 4/8 >> negative Sputum 4/8 >> normal flora UC 4/8 >> negative  RVP 4/9 >> negative  COVID 4/9 >> POSITIVE  Quantiferon gold 4/9 >> negative  Antimicrobials:  Azithromycin 4/8 >> 4/14 Ceftriaxone 4/8 >> 4/14 Plaquenil 4/6 >> completed   Interim history/subjective:  No events overnight, no new complaints   Objective:  Blood pressure (!) 167/88, pulse 79, temperature 99.8 F (37.7 C), temperature source Axillary, resp. rate (!) 30, height 5\' 4"  (1.626 m), weight 133.8 kg, SpO2 98 %.    Vent Mode: PSV;CPAP FiO2 (%):  [30 %-60 %] 40 % Set Rate:  [30 bmp] 30 bmp Vt Set:  [400 mL] 400 mL PEEP:  [5 cmH20-10 cmH20] 5 cmH20 Pressure Support:  [5 cmH20-10 cmH20] 10 cmH20 Plateau Pressure:  [13 cmH20-23 cmH20] 23 cmH20   Intake/Output Summary (Last 24 hours) at 02/24/2019 0953 Last data filed at 02/24/2019 2637 Gross per 24 hour  Intake 1868.9 ml  Output 2645 ml  Net -776.1 ml   Filed Weights   02/21/19 0500 02/23/19  0433 02/24/19 0443  Weight: 129.3 kg 135 kg 133.8 kg    Examination: General: Acutely ill appearing female HEENT: [place]/AT, PERRL, EOM-I and MMM Neuro: Sedate, withdraws to pain but not command CV: RRR, Nl S1/S2 and -M/R/G PULM: CTA bilaterally GI: Soft, NT, ND and +BS Extremities: warm/dry, generalized 1-2+ edema  Skin: no rashes or lesions  Assessment & Plan:   Acute Hypoxic Respiratory Failure secondary to SARS-CoV2 -PaO2/FiO2 ratio 115 4/14 > 142 4/14 -s/p Actmera 4/9 P: Continue ARDS protocol Titrate O2 for sat of 88-92% D/C prone positioning Diureses as able Replace electrolytes D/C propofol Continue sedation with Klonopin, oxycodone, Dilaudid D/C ketamine  Best Practice:  Diet: TF's. Pain/Anxiety/Delirium protocol (if indicated): In place,  RASS goal -3 VAP protocol (if indicated): In place. DVT prophylaxis: Lovenox GI prophylaxis: PPI. Glucose control: N/A. Mobility: Bedrest. Code Status: Full. Family Communication: per TRH.  Disposition: ICU.  Labs   CBC: Recent Labs  Lab 02/18/19 0427  02/19/19 0424  02/20/19 0345  02/21/19 0339  02/23/19 0420 02/23/19 0515 02/23/19 1155 02/24/19 0315 02/24/19 0435  WBC 3.0*  --  4.0  --  5.3  --  4.7  --  5.2  --   --   --  4.6  NEUTROABS 2.5  --  2.8  --  3.7  --   --   --   --   --   --   --   --  HGB 12.0   < > 12.7   < > 12.7   < > 10.2*   < > 13.1 13.9 13.6 12.6 12.7  HCT 41.1   < > 43.7   < > 43.3   < > 34.9*   < > 43.8 41.0 40.0 37.0 41.6  MCV 99.8  --  99.1  --  99.1  --  100.6*  --  99.1  --   --   --  98.8  PLT 177  --  192  --  211  --  204  --  267  --   --   --  254   < > = values in this interval not displayed.   Basic Metabolic Panel: Recent Labs  Lab 02/18/19 0427  02/19/19 0424  02/20/19 0345  02/21/19 0339  02/21/19 0530  02/22/19 0500  02/23/19 0420 02/23/19 0515 02/23/19 1155 02/24/19 0315 02/24/19 0435  NA 144   < > 145   < > 145   < > 145   < >  --    < > 144   < > 144 144 145  147* 145  K 3.6   < > 3.9   < > 3.9   < > 2.7*   < >  --    < > 3.4*   < > 3.8 3.9 3.8 3.0* 2.8*  CL 100  --  100  --  96*  --  110  --   --   --  100  --  102  --   --   --  102  CO2 31  --  33*  --  36*  --  27  --   --   --  33*  --  33*  --   --   --  35*  GLUCOSE 101*  --  115*  --  113*  --  90  --   --   --  118*  --  168*  --   --   --  158*  BUN 11  --  8  --  12  --  10  --   --   --  19  --  28*  --   --   --  39*  CREATININE 0.82  --  0.74  --  0.96  --  0.73  --   --   --  0.77  --  0.68  --   --   --  0.65  CALCIUM 7.6*  --  7.9*  --  8.6*  --  6.8*  --   --   --  8.7*  --  8.9  --   --   --  9.0  MG 1.9  --  1.9  --  1.9  --   --   --  2.0  --  2.3  --   --   --   --   --   --   PHOS 3.7  --  4.1  --  4.6  --   --   --   --   --   --   --   --   --   --   --   --    < > = values in this interval not displayed.   GFR: Estimated Creatinine Clearance: 91.7 mL/min (by C-G formula based on SCr of 0.65 mg/dL). Recent Labs  Lab 02/20/19 0345 02/21/19 [id number] 02/23/19 0420  02/24/19 0435  PROCALCITON <0.10  --   --   --   WBC 5.3 4.7 5.2 4.6   Liver Function Tests: Recent Labs  Lab 02/18/19 0427 02/19/19 0424 02/23/19 0420  AST ALT 19 25 32  ALKPHOS 95 101 108  BILITOT 0.7 1.0 0.6  PROT 5.4* 5.4* 6.6  ALBUMIN 2.5* 2.7* 3.5   No results for input(s): LIPASE, AMYLASE in the last 168 hours. No results for input(s): AMMONIA in the last 168 hours.   ABG    Component Value Date/Time   PHART 7.480 (H) 02/24/2019 0315   PCO2ART 52.7 (H) 02/24/2019 0315   PO2ART 62.0 (L) 02/24/2019 0315   HCO3 39.3 (H) 02/24/2019 0315   TCO2 41 (H) 02/24/2019 0315   O2SAT 92.0 02/24/2019 0315    Coagulation Profile: No results for input(s): INR, PROTIME in the last 168 hours. Cardiac Enzymes: No results for input(s): CKTOTAL, CKMB, CKMBINDEX, TROPONINI in the last 168 hours. HbA1C: No results found for: HGBA1C CBG: Recent Labs  Lab 02/23/19 1536 02/23/19 2016 02/23/19  2316 02/24/19 0254 02/24/19 0746  GLUCAP 154* 208* 168* 148* 179*   The patient is critically ill with multiple organ systems failure and requires high complexity decision making for assessment and support, frequent evaluation and titration of therapies, application of advanced monitoring technologies and extensive interpretation of multiple databases.   Critical Care Time devoted to patient care services described in this note is  32  Minutes. This time reflects time of care of this signee Dr Koren Bound. This critical care time does not reflect procedure time, or teaching time or supervisory time of PA/NP/Med student/Med Resident etc but could involve care discussion time.  Alyson Reedy, M.D. [facility] Pulmonary/Critical Care Medicine. Pager: [phone]. After hours pager: [phone].

## 2019-02-24 NOTE — Progress Notes (Signed)
Spoke with daughter for an update. Daughter appreciative for all care. Will set up time for [facility] camera. Will continue to monitor. Dicie Beam RN BSN.

## 2019-02-24 NOTE — Plan of Care (Signed)
  Problem: Education: Goal: Knowledge of General Education information will improve Description Including pain rating scale, medication(s)/side effects and non-pharmacologic comfort measures 02/24/2019 0700 by Kizzie Bane, RN Outcome: Progressing 02/24/2019 0700 by Kizzie Bane, RN Outcome: Progressing   Problem: Health Behavior/Discharge Planning: Goal: Ability to manage health-related needs will improve 02/24/2019 0700 by Kizzie Bane, RN Outcome: Progressing 02/24/2019 0700 by Kizzie Bane, RN Outcome: Progressing   Problem: Clinical Measurements: Goal: Ability to maintain clinical measurements within normal limits will improve 02/24/2019 0700 by Kizzie Bane, RN Outcome: Progressing 02/24/2019 0700 by Kizzie Bane, RN Outcome: Progressing Goal: Will remain free from infection 02/24/2019 0700 by Kizzie Bane, RN Outcome: Progressing 02/24/2019 0700 by Kizzie Bane, RN Outcome: Progressing Goal: Diagnostic test results will improve 02/24/2019 0700 by Kizzie Bane, RN Outcome: Progressing 02/24/2019 0700 by Kizzie Bane, RN Outcome: Progressing Goal: Respiratory complications will improve 02/24/2019 0700 by Kizzie Bane, RN Outcome: Progressing 02/24/2019 0700 by Kizzie Bane, RN Outcome: Progressing Goal: Cardiovascular complication will be avoided 02/24/2019 0700 by Kizzie Bane, RN Outcome: Progressing 02/24/2019 0700 by Kizzie Bane, RN Outcome: Progressing   Problem: Activity: Goal: Risk for activity intolerance will decrease 02/24/2019 0700 by Kizzie Bane, RN Outcome: Progressing 02/24/2019 0700 by Kizzie Bane, RN Outcome: Progressing   Problem: Nutrition: Goal: Adequate nutrition will be maintained 02/24/2019 0700 by Kizzie Bane, RN Outcome: Progressing 02/24/2019 0700 by Kizzie Bane, RN Outcome: Progressing   Problem: Coping: Goal: Level  of anxiety will decrease 02/24/2019 0700 by Kizzie Bane, RN Outcome: Progressing 02/24/2019 0700 by Kizzie Bane, RN Outcome: Progressing   Problem: Elimination: Goal: Will not experience complications related to bowel motility 02/24/2019 0700 by Kizzie Bane, RN Outcome: Progressing 02/24/2019 0700 by Kizzie Bane, RN Outcome: Progressing Goal: Will not experience complications related to urinary retention 02/24/2019 0700 by Kizzie Bane, RN Outcome: Progressing 02/24/2019 0700 by Kizzie Bane, RN Outcome: Progressing   Problem: Pain Managment: Goal: General experience of comfort will improve 02/24/2019 0700 by Kizzie Bane, RN Outcome: Progressing 02/24/2019 0700 by Kizzie Bane, RN Outcome: Progressing   Problem: Safety: Goal: Ability to remain free from injury will improve 02/24/2019 0700 by Kizzie Bane, RN Outcome: Progressing 02/24/2019 0700 by Kizzie Bane, RN Outcome: Progressing   Problem: Skin Integrity: Goal: Risk for impaired skin integrity will decrease 02/24/2019 0700 by Kizzie Bane, RN Outcome: Progressing 02/24/2019 0700 by Kizzie Bane, RN Outcome: Progressing   Problem: Activity: Goal: Ability to tolerate increased activity will improve 02/24/2019 0700 by Kizzie Bane, RN Outcome: Progressing 02/24/2019 0700 by Kizzie Bane, RN Outcome: Progressing   Problem: Respiratory: Goal: Ability to maintain a clear airway and adequate ventilation will improve 02/24/2019 0700 by Kizzie Bane, RN Outcome: Progressing 02/24/2019 0700 by Kizzie Bane, RN Outcome: Progressing   Problem: Role Relationship: Goal: Method of communication will improve 02/24/2019 0700 by Kizzie Bane, RN Outcome: Progressing 02/24/2019 0700 by Kizzie Bane, RN Outcome: Progressing

## 2019-02-24 NOTE — Progress Notes (Signed)
Assisted tele visit to patient with daughter.  Sherry Cuoco M Caswell Alvillar, RN   

## 2019-02-25 ENCOUNTER — Inpatient Hospital Stay (HOSPITAL_COMMUNITY): Payer: Medicare Other

## 2019-02-25 DIAGNOSIS — R509 Fever, unspecified: Secondary | ICD-10-CM

## 2019-02-25 DIAGNOSIS — E876 Hypokalemia: Secondary | ICD-10-CM

## 2019-02-25 DIAGNOSIS — G9340 Encephalopathy, unspecified: Secondary | ICD-10-CM

## 2019-02-25 LAB — POCT I-STAT 7, (LYTES, BLD GAS, ICA,H+H)
Acid-Base Excess: 13 mmol/L — ABNORMAL HIGH (ref 0.0–2.0)
Bicarbonate: 38.4 mmol/L — ABNORMAL HIGH (ref 20.0–28.0)
Calcium, Ion: 1.18 mmol/L (ref 1.15–1.40)
HCT: 40 % (ref 36.0–46.0)
Hemoglobin: 13.6 g/dL (ref 12.0–15.0)
O2 Saturation: 96 %
Patient temperature: 37.3
Potassium: 3.3 mmol/L — ABNORMAL LOW (ref 3.5–5.1)
Sodium: 148 mmol/L — ABNORMAL HIGH (ref 135–145)
TCO2: 40 mmol/L — ABNORMAL HIGH (ref 22–32)
pCO2 arterial: 50.7 mmHg — ABNORMAL HIGH (ref 32.0–48.0)
pH, Arterial: 7.488 — ABNORMAL HIGH (ref 7.350–7.450)
pO2, Arterial: 77 mmHg — ABNORMAL LOW (ref 83.0–108.0)

## 2019-02-25 LAB — BASIC METABOLIC PANEL
Anion gap: 12 (ref 5–15)
Anion gap: 9 (ref 5–15)
BUN: 36 mg/dL — ABNORMAL HIGH (ref 8–23)
BUN: 35 mg/dL — ABNORMAL HIGH (ref 8–23)
CO2: 33 mmol/L — ABNORMAL HIGH (ref 22–32)
CO2: 35 mmol/L — ABNORMAL HIGH (ref 22–32)
Calcium: 8.7 mg/dL — ABNORMAL LOW (ref 8.9–10.3)
Calcium: 8.9 mg/dL (ref 8.9–10.3)
Chloride: 100 mmol/L (ref 98–111)
Chloride: 103 mmol/L (ref 98–111)
Creatinine, Ser: 0.7 mg/dL (ref 0.44–1.00)
Creatinine, Ser: 0.64 mg/dL (ref 0.44–1.00)
GFR calc Af Amer: >60 mL/min (ref >60)
GFR calc Af Amer: >60 mL/min (ref >60)
GFR calc non Af Amer: >60 mL/min (ref >60)
GFR calc non Af Amer: >60 mL/min (ref >60)
Glucose, Bld: 168 mg/dL — ABNORMAL HIGH (ref 70–99)
Glucose, Bld: 123 mg/dL — ABNORMAL HIGH (ref 70–99)
Potassium: 3.2 mmol/L — ABNORMAL LOW (ref 3.5–5.1)
Potassium: 2.8 mmol/L — ABNORMAL LOW (ref 3.5–5.1)
Sodium: 145 mmol/L (ref 135–145)
Sodium: 147 mmol/L — ABNORMAL HIGH (ref 135–145)

## 2019-02-25 LAB — URINE CULTURE: Culture: NO GROWTH

## 2019-02-25 LAB — CBC
HCT: 42.9 % (ref 36.0–46.0)
Hemoglobin: 12.9 g/dL (ref 12.0–15.0)
MCH: 29.7 pg (ref 26.0–34.0)
MCHC: 30.1 g/dL (ref 30.0–36.0)
MCV: 98.8 fL (ref 80.0–100.0)
Platelets: 289 10*3/uL (ref 150–400)
RBC: 4.34 MIL/uL (ref 3.87–5.11)
RDW: 14.2 % (ref 11.5–15.5)
WBC: 5.4 10*3/uL (ref 4.0–10.5)
nRBC: 0 % (ref 0.0–0.2)

## 2019-02-25 LAB — MAGNESIUM: Magnesium: 2.5 mg/dL — ABNORMAL HIGH (ref 1.7–2.4)

## 2019-02-25 LAB — GLUCOSE, CAPILLARY
Glucose-Capillary: 107 mg/dL — ABNORMAL HIGH (ref 70–99)
Glucose-Capillary: 119 mg/dL — ABNORMAL HIGH (ref 70–99)
Glucose-Capillary: 132 mg/dL — ABNORMAL HIGH (ref 70–99)
Glucose-Capillary: 180 mg/dL — ABNORMAL HIGH (ref 70–99)
Glucose-Capillary: 202 mg/dL — ABNORMAL HIGH (ref 70–99)
Glucose-Capillary: 206 mg/dL — ABNORMAL HIGH (ref 70–99)

## 2019-02-25 MED ORDER — CHLORHEXIDINE GLUCONATE 0.12 % MT SOLN
15.0000 mL | Freq: Two times a day (BID) | OROMUCOSAL | Status: DC
  Administered 2019-02-26 – 2019-03-01 (×5): 15 mL via OROMUCOSAL
  Filled 2019-02-25 (×6): qty 15

## 2019-02-25 MED ORDER — QUETIAPINE FUMARATE 50 MG PO TABS
50.0000 mg | ORAL_TABLET | Freq: Two times a day (BID) | ORAL | Status: DC
  Administered 2019-02-25: 50 mg via ORAL
  Filled 2019-02-25: qty 1

## 2019-02-25 MED ORDER — GUAIFENESIN ER 600 MG PO TB12
600.0000 mg | ORAL_TABLET | Freq: Two times a day (BID) | ORAL | Status: DC
  Administered 2019-02-25 – 2019-03-01 (×8): 600 mg via ORAL
  Filled 2019-02-25 (×11): qty 1

## 2019-02-25 MED ORDER — ORAL CARE MOUTH RINSE
15.0000 mL | Freq: Two times a day (BID) | OROMUCOSAL | Status: DC
  Administered 2019-02-25 – 2019-02-28 (×8): 15 mL via OROMUCOSAL

## 2019-02-25 MED ORDER — FENTANYL CITRATE (PF) 100 MCG/2ML IJ SOLN: 25.0000 ug | INTRAMUSCULAR | Status: DC | PRN

## 2019-02-25 MED ORDER — ACETAMINOPHEN 325 MG PO TABS: 650.0000 mg | ORAL_TABLET | Freq: Four times a day (QID) | ORAL | Status: DC | PRN

## 2019-02-25 MED ORDER — LIP MEDEX EX OINT
TOPICAL_OINTMENT | CUTANEOUS | Status: DC | PRN
  Filled 2019-02-25: qty 7

## 2019-02-25 MED ORDER — POTASSIUM CHLORIDE CRYS ER 20 MEQ PO TBCR: 40.0000 meq | EXTENDED_RELEASE_TABLET | Freq: Once | ORAL | Status: DC

## 2019-02-25 MED ORDER — DULOXETINE HCL 20 MG PO CPEP
20.0000 mg | ORAL_CAPSULE | Freq: Every day | ORAL | Status: DC
  Administered 2019-02-26 – 2019-03-01 (×3): 20 mg via ORAL
  Filled 2019-02-25 (×5): qty 1

## 2019-02-25 MED ORDER — POTASSIUM CHLORIDE 10 MEQ/100ML IV SOLN
10.0000 meq | INTRAVENOUS | Status: AC
  Administered 2019-02-25 (×4): 10 meq via INTRAVENOUS
  Filled 2019-02-25 (×4): qty 100

## 2019-02-25 MED ORDER — BISACODYL 5 MG PO TBEC: 5.0000 mg | DELAYED_RELEASE_TABLET | Freq: Every day | ORAL | Status: DC | PRN

## 2019-02-25 MED ORDER — LORAZEPAM 2 MG/ML IJ SOLN: 0.5000 mg | Freq: Four times a day (QID) | INTRAMUSCULAR | Status: DC | PRN

## 2019-02-25 NOTE — Procedures (Signed)
**Note De-Identified Khamauri Bauernfeind Obfuscation** Extubation Procedure Note  Patient Details:   Name: Shanzay Armbrecht DOB: Oct 02, 1951 MRN: [id number]   Airway Documentation:    Vent end date: 02/25/19 Vent end time: 1002   Evaluation  O2 sats: stable throughout Complications: No apparent complications Patient did tolerate procedure well. Bilateral Breath Sounds: Clear, Diminished   Yes VS WNL< no stridor noted and +leak Margurete Guaman, Megan Salon 02/25/2019, 10:04 AM

## 2019-02-25 NOTE — Progress Notes (Signed)
NAME:  Sherry Becker, MRN:  [id number], DOB:  1951-03-29, LOS: 10 ADMISSION DATE:  02/15/2019, CONSULTATION DATE:  02/15/19 REFERRING MD:  [facility]  CHIEF COMPLAINT:  SOB   Brief History   Sherry Becker is a 68 y.o. female who was admitted 4/8 after transfer from [facility] with acute hypoxic respiratory failure requiring intubation in the setting of bilateral infiltrates.  COVID positive.      Past Medical History  No definitive history but per med list from [place], suspect combo of depression and mood disorder along with HLD.  Probable OSA.  Significant Hospital Events   4/08 Admit, intubated. 4/09 Prone  4/10 LCB 4/13 Prone 0230. Remains on ketamine, fentanyl, propofol gtt's >transition to dilaudid gtt  Consults:     Procedures:  ETT 4/8 >> 4/18  CVC 4/9 >> L Rad A Line 4/9 >>   Significant Diagnostic Tests:  CXR 4/8 >> extensive bilateral pulmonary infiltrates.  Micro Data:  Blood 4/8 >> negative Sputum 4/8 >> normal flora UC 4/8 >> negative  RVP 4/9 >> negative  COVID 4/9 >> POSITIVE  Quantiferon gold 4/9 >> negative  Antimicrobials:  Azithromycin 4/8 >> 4/14 Ceftriaxone 4/8 >> 4/14 Plaquenil 4/6 >> completed   Interim history/subjective:  Awake, following commands Weaned for 6 hours yesterday Tolerating weaning 5/8 this morning 50% FiO2, normal work of breathing Wants tube out New fever   Objective:  Blood pressure (!) 144/75, pulse 79, temperature 99.1 F (37.3 C), temperature source Axillary, resp. rate (!) 30, height 5\' 4"  (1.626 m), weight 134.3 kg, SpO2 90 %.    Vent Mode: CPAP;PSV FiO2 (%):  [40 %] 40 % Set Rate:  [30 bmp] 30 bmp Vt Set:  [400 mL] 400 mL PEEP:  [5 cmH20-8 cmH20] 8 cmH20 Pressure Support:  [10 cmH20] 10 cmH20 Plateau Pressure:  [15 cmH20-20 cmH20] 18 cmH20   Intake/Output Summary (Last 24 hours) at 02/25/2019 0841 Last data filed at 02/25/2019 0300 Gross per 24 hour  Intake 692.06 ml  Output 3060 ml  Net  -2367.94 ml   Filed Weights   02/23/19 0433 02/24/19 0443 02/25/19 0500  Weight: 135 kg 133.8 kg 134.3 kg    Examination:  General:  In bed on vent HENT: NCAT ETT in place PULM: CTA B, vent supported breathing CV: RRR, no mgr GI: BS+, soft, nontender MSK: normal bulk and tone Neuro: awake on vent, following commands    Assessment & Plan:   Acute Hypoxic Respiratory Failure secondary to SARS-CoV2 significantly improved -PaO2/FiO2 ratio 115 4/14 > 142 4/14 -s/p Actmera 4/9 P: Extubate to high flow nasal cannula today Close monitoring in the ICU Aspiration precautions Speech therapy evaluation after extubation N.p.o. after extubation Continue diuresis   Best Practice:  Diet: N.p.o. for now Pain/Anxiety/Delirium protocol (if indicated): Discontinue VAP protocol (if indicated): Yes DVT prophylaxis: Lovenox GI prophylaxis: PPI. Glucose control: N/A. Mobility: Bedrest. Code Status: Full. Family Communication: per TRH.  Disposition: ICU.  Labs   CBC: Recent Labs  Lab 02/19/19 0424  02/20/19 0345  02/21/19 0339  02/23/19 0420 02/23/19 0515 02/23/19 1155 02/24/19 0315 02/24/19 0435 02/25/19 0235  WBC 4.0  --  5.3  --  4.7  --  5.2  --   --   --  4.6 5.4  NEUTROABS 2.8  --  3.7  --   --   --   --   --   --   --   --   --   HGB 12.7   < >  12.7   < > 10.2*   < > 13.1 13.9 13.6 12.6 12.7 12.9  HCT 43.7   < > 43.3   < > 34.9*   < > 43.8 41.0 40.0 37.0 41.6 42.9  MCV 99.1  --  99.1  --  100.6*  --  99.1  --   --   --  98.8 98.8  PLT 192  --  211  --  204  --  267  --   --   --  254 289   < > = values in this interval not displayed.   Basic Metabolic Panel: Recent Labs  Lab 02/19/19 0424  02/20/19 0345  02/21/19 0339  02/21/19 0530  02/22/19 0500  02/23/19 0420 02/23/19 0515 02/23/19 1155 02/24/19 0315 02/24/19 0435 02/25/19 0235  NA 145   < > 145   < > 145   < >  --    < > 144   < > 144 144 145 147* 145 145  K 3.9   < > 3.9   < > 2.7*   < >  --    < >  3.4*   < > 3.8 3.9 3.8 3.0* 2.8* 3.2*  CL 100  --  96*  --  110  --   --   --  100  --  102  --   --   --  102 100  CO2 33*  --  36*  --  27  --   --   --  33*  --  33*  --   --   --  35* 33*  GLUCOSE 115*  --  113*  --  90  --   --   --  118*  --  168*  --   --   --  158* 168*  BUN 8  --  12  --  10  --   --   --  19  --  28*  --   --   --  39* 36*  CREATININE 0.74  --  0.96  --  0.73  --   --   --  0.77  --  0.68  --   --   --  0.65 0.70  CALCIUM 7.9*  --  8.6*  --  6.8*  --   --   --  8.7*  --  8.9  --   --   --  9.0 8.7*  MG 1.9  --  1.9  --   --   --  2.0  --  2.3  --   --   --   --   --   --  2.5*  PHOS 4.1  --  4.6  --   --   --   --   --   --   --   --   --   --   --   --   --    < > = values in this interval not displayed.   GFR: Estimated Creatinine Clearance: 91.9 mL/min (by C-G formula based on SCr of 0.7 mg/dL). Recent Labs  Lab 02/20/19 0345 02/21/19 0339 02/23/19 0420 02/24/19 0435 02/25/19 0235  PROCALCITON <0.10  --   --   --   --   WBC 5.3 4.7 5.2 4.6 5.4   Liver Function Tests: Recent Labs  Lab 02/19/19 0424 02/23/19 0420  AST 31 27  ALT 25 32  ALKPHOS 101 108  BILITOT 1.0 0.6  PROT 5.4* 6.6  ALBUMIN 2.7* 3.5   No results for input(s): LIPASE, AMYLASE in the last 168 hours. No results for input(s): AMMONIA in the last 168 hours.   ABG    Component Value Date/Time   PHART 7.480 (H) 02/24/2019 0315   PCO2ART 52.7 (H) 02/24/2019 0315   PO2ART 62.0 (L) 02/24/2019 0315   HCO3 39.3 (H) 02/24/2019 0315   TCO2 41 (H) 02/24/2019 0315   O2SAT 92.0 02/24/2019 0315    Coagulation Profile: No results for input(s): INR, PROTIME in the last 168 hours. Cardiac Enzymes: No results for input(s): CKTOTAL, CKMB, CKMBINDEX, TROPONINI in the last 168 hours. HbA1C: No results found for: HGBA1C CBG: Recent Labs  Lab 02/24/19 1541 02/24/19 1601 02/24/19 2017 02/24/19 2354 02/25/19 0345  GLUCAP 201* 237* 199* 202* 206*   My critical care time caring for this  patient today is 32 minutes, she requires high complexity of medical decision making because of multiple organ system failure, need for mechanical ventilation.  Sherry Becker , MD [place] [facility] Pager: [phone] Cell: [phone] If no response, call [phone]

## 2019-02-25 NOTE — Plan of Care (Signed)
Pt was extubated today and is tolerating a few ice chips every hour.  Speech to evaluate for swallow. Will continue to monitor Jaclyn Shaggy RN

## 2019-02-25 NOTE — Evaluation (Signed)
Physical Therapy Evaluation Patient Details Name: Sherry Becker MRN: [id number] DOB: 09-04-1951 Today's Date: 02/25/2019   History of Present Illness  68 y.o. female who was admitted 4/8 after transfer from [facility]  to [facility] with acute hypoxic respiratory failure requiring intubation in the setting of bilateral infiltrates. COVID positive; 4/14 Transfer to [facility]; ETT 4/8 >> 4/18.   PMHx: mood disorder, depression, obesity  Clinical Impression  Pt admitted with above diagnosis. Pt currently with functional limitations due to the deficits listed below (see PT Problem List).  Pt seen for PT eval today, extubated this am;  VSS with SpO2=87-94% on 3L [place], HR 110s, mildly tachy end of session but in NAD;  Will benefit from continued PT at [facility]. Recommend CIR vs HHPT pending progress with mobility/cognition;  Pt will benefit from skilled PT to increase their independence and safety with mobility to allow discharge to the venue listed below.         Follow Up Recommendations CIR(vs HHPT pending progress)    Equipment Recommendations  Other (comment)(TBD)    Recommendations for Other Services       Precautions / Restrictions Precautions Precautions: Fall Precaution Comments: monitor VS, on 3L  O2; rectal tube/flexiseal      Mobility  Bed Mobility Overal bed mobility: Needs Assistance Bed Mobility: Supine to Sit;Sit to Supine     Supine to sit: Mod assist;+2 for physical assistance Sit to supine: +2 for physical assistance;Mod assist   General bed mobility comments: assist for trunk and LEs, incr time, cues for sequencing of task  Transfers Overall transfer level: Needs assistance Equipment used: Ambulation equipment used Transfers: Sit to/from Stand Sit to Stand: Min assist;+2 physical assistance;+2 safety/equipment         General transfer comment: assist/cues for anterior-superior wt shift and to control descent  Ambulation/Gait             General  Gait Details: not tested  Stairs            Wheelchair Mobility    Modified Rankin (Stroke Patients Only)       Balance Overall balance assessment: Needs assistance Sitting-balance support: No upper extremity supported;Feet supported;Single extremity supported Sitting balance-Leahy Scale: Fair Sitting balance - Comments: cues for trunk extension/posture; pt able to sit EOB for ~10 minutes   Standing balance support: Bilateral upper extremity supported Standing balance-Leahy Scale: Poor Standing balance comment: reliant on UEs and external assist                             Pertinent Vitals/Pain Pain Assessment: No/denies pain    Home Living Family/patient expects to be discharged to:: Private residence Living Arrangements: Alone             Home Equipment: None Additional Comments: per pt she lives alone, works full time, is independent at her baseline    Prior Function Level of Independence: Independent               Hand Dominance        Extremity/Trunk Assessment   Upper Extremity Assessment Upper Extremity Assessment: Generalized weakness;Defer to OT evaluation    Lower Extremity Assessment Lower Extremity Assessment: Generalized weakness       Communication   Communication: Other (comment)(soft voice, extubated this am)  Cognition Arousal/Alertness: Awake/alert Behavior During Therapy: Impulsive(mildly impulsive) Overall Cognitive Status: Impaired/Different from baseline Area of Impairment: Awareness;Problem solving  Orientation Level: Disoriented to;Time;Place     Following Commands: Follows multi-step commands inconsistently;Follows one step commands with increased time   Awareness: Intellectual Problem Solving: Slow processing;Difficulty sequencing;Requires verbal cues;Requires tactile cues General Comments: pt is alert, able to recall setting and day/month after being oriented by PT       General Comments      Exercises     Assessment/Plan    PT Assessment Patient needs continued PT services  PT Problem List Decreased strength;Decreased activity tolerance;Decreased balance;Decreased knowledge of use of DME;Decreased cognition;Decreased mobility;Decreased safety awareness       PT Treatment Interventions DME instruction;Gait training;Functional mobility training;Therapeutic activities;Balance training;Patient/family education;Therapeutic exercise;Neuromuscular re-education;Cognitive remediation    PT Goals (Current goals can be found in the Care Plan section)  Acute Rehab PT Goals Patient Stated Goal: get stronger, back to work PT Goal Formulation: With patient Time For Goal Achievement: 03/11/19 Potential to Achieve Goals: Good    Frequency Min 4X/week   Barriers to discharge        Co-evaluation               AM-PAC PT "6 Clicks" Mobility  Outcome Measure Help needed turning from your back to your side while in a flat bed without using bedrails?: A Lot Help needed moving from lying on your back to sitting on the side of a flat bed without using bedrails?: A Lot Help needed moving to and from a bed to a chair (including a wheelchair)?: A Lot Help needed standing up from a chair using your arms (e.g., wheelchair or bedside chair)?: A Lot Help needed to walk in hospital room?: Total Help needed climbing 3-5 steps with a railing? : Total 6 Click Score: 10    End of Session   Activity Tolerance: Patient tolerated treatment well Patient left: with call bell/phone within reach;with nursing/sitter in room;in bed   PT Visit Diagnosis: Muscle weakness (generalized) (M62.81);Other abnormalities of gait and mobility (R26.89)    Time: 1400-1445 PT Time Calculation (min) (ACUTE ONLY): 45 min   Charges:   PT Evaluation $PT Eval Moderate Complexity: 1 Mod PT Treatments $Therapeutic Activity: 8-22 mins        Drucilla Chaletara Nadelyn Enriques, PT  Pager:  [phone] Acute Rehab Dept [facility]): [phone]   02/25/2019   [facility],Jessa Stinson 02/25/2019, 5:40 PM

## 2019-02-25 NOTE — Progress Notes (Signed)
Poke with RN re PIV need.  States has subcutaneous site that is working well now for MSO4 need.  Will check this afternoon for PIV.

## 2019-02-25 NOTE — Progress Notes (Signed)
PROGRESS NOTE                                                                                                                                                                                                             Patient Demographics:    Sherry Becker, is a 68 y.o. female, DOB - April 13, 1951, ZOX:[id number]  Outpatient Primary MD for the patient is Patient, No Pcp Per    LOS - 10  Brief Narrative: Patient is a 68 y.o. female with PMHx of depression, probable OSA-who presented from [facility] as a transfer on 4/8 with acute hypoxic respiratory failure secondary to COVID 19 viral pneumonia.   Significant events: 4/08 Admit, intubated. 4/8>>4/13 Plaquenil 4/9 Actemra 4/09 Prone  4/10 LCB 4/13 Prone 0230. Remains on ketamine, fentanyl, propofol gtt's >transition to dilaudid gtt 4/14 Transfer to [facility] 4/14 Worsening Hypoxia/near resp arrest due to mucus plugging/proned   Subjective:   Patient was extubated earlier this morning.  She seems to be doing well.  Mildly confused.  But denies any pain.   Assessment  & Plan :   Acute hypoxic respiratory failure secondary to ARDS due to COVID-19 viral pneumonia: Patient had to be intubated and was on mechanical ventilation.  She was seen by pulmonology.  Patient has improved.  Patient has been given Actemra.  She is completed course of hydroxychloroquine.  There was some concern for cord edema and so she was given Decadron x4 doses.  Patient has responded well to these treatments.  She was extubated this morning.  Continue to monitor closely.  Blood cultures and respiratory viral panel negative.  Fever  Developed a fever yesterday evening and was noted to be febrile this morning as well.  WBC is normal.  Blood cultures were sent.  UA did not show any evidence for infection.  Continue to monitor off of antibiotics for now.  If she continues to spike fevers may have to  empirically place her on broad-spectrum antibiotics.  Hypokalemia: Will be repleted.  Magnesium 2.5.    Hypotension: Resolved-possibly related to sedative agents.  Bradycardia: Resolved-thought to be related to sedative agents  Depression/anxiety: Continue Klonopin and Seroquel,Cymbalta on hold-we will resume when able  Morbid obesity Body mass index is 50.81 kg/m.  Vent Settings: Vent Mode: CPAP;PSV FiO2 (%):  [40 %] 40 % Set Rate:  [30  bmp] 30 bmp Vt Set:  [400 mL] 400 mL PEEP:  [5 cmH20-8 cmH20] 8 cmH20 Pressure Support:  [10 cmH20] 10 cmH20 Plateau Pressure:  [15 cmH20-20 cmH20] 18 cmH20  ABG:    Component Value Date/Time   PHART 7.480 (H) 02/24/2019 0315   PCO2ART 52.7 (H) 02/24/2019 0315   PO2ART 62.0 (L) 02/24/2019 0315   HCO3 39.3 (H) 02/24/2019 0315   TCO2 41 (H) 02/24/2019 0315   O2SAT 92.0 02/24/2019 0315    Code Status : Partial code  Diet : NPO-NG feeds in place  Disposition Plan  :  Remain in the ICU  Consults  :  PCCM  Procedures  :   ETT 4/8>> CVC/left IJ 4/9>>  GI prophylaxis: PPI  DVT Prophylaxis  :  Lovenox  Lab Results  Component Value Date   PLT 289 02/25/2019    Inpatient Medications  Scheduled Meds:  chlorhexidine  15 mL Mouth Rinse BID   Chlorhexidine Gluconate Cloth  6 each Topical Daily   enoxaparin (LOVENOX) injection  60 mg Subcutaneous Q24H   feeding supplement (PRO-STAT SUGAR FREE 64)  60 mL Per Tube BID   guaiFENesin  15 mL Per Tube Q6H   insulin aspart  4 Units Subcutaneous Q4H   mouth rinse  15 mL Mouth Rinse q12n4p   pantoprazole sodium  40 mg Per Tube Q24H   polyethylene glycol  17 g Oral Daily   potassium chloride  40 mEq Per Tube Daily   QUEtiapine  50 mg Per Tube BID   Continuous Infusions:  sodium chloride 10 mL/hr at 02/21/19 1641   feeding supplement (OSMOLITE 1.2 CAL) Stopped (02/23/19 1700)   PRN Meds:.Place/Maintain arterial line **AND** sodium chloride, acetaminophen, bisacodyl,  fentaNYL (SUBLIMAZE) injection, hydrALAZINE, ipratropium-albuterol, labetalol, LORazepam  Antibiotics  :    Anti-infectives (From admission, onward)   Start     Dose/Rate Route Frequency Ordered Stop   02/16/19 2200  hydroxychloroquine (PLAQUENIL) tablet 200 mg     200 mg Per Tube 2 times daily 02/15/19 2051 02/20/19 0908   02/15/19 2200  hydroxychloroquine (PLAQUENIL) tablet 400 mg     400 mg Per Tube 2 times daily 02/15/19 2051 02/16/19 2159   02/15/19 2130  azithromycin (ZITHROMAX) 500 mg in sodium chloride 0.9 % 250 mL IVPB  Status:  Discontinued     500 mg 250 mL/hr over 60 Minutes Intravenous Daily at bedtime 02/15/19 2051 02/20/19 0923   02/15/19 2130  cefTRIAXone (ROCEPHIN) 2 g in sodium chloride 0.9 % 100 mL IVPB  Status:  Discontinued     2 g 200 mL/hr over 30 Minutes Intravenous Daily at bedtime 02/15/19 2057 02/20/19 0923   02/15/19 2100  cefTRIAXone (ROCEPHIN) injection 1 g  Status:  Discontinued     1 g Intramuscular Every 24 hours 02/15/19 2051 02/15/19 2057           Objective:   Vitals:   02/25/19 0900 02/25/19 1000 02/25/19 1100 02/25/19 1200  BP: (!) 189/79 (!) 145/70 (!) 156/86 (!) 146/81  Pulse: 99 97 93 89  Resp: 20 (!) 22 (!) 24 (!) 24  Temp:    (!) 100.6 F (38.1 C)  TempSrc:    Axillary  SpO2: 92% 96% 95% 93%  Weight:      Height:        Wt Readings from Last 3 Encounters:  02/25/19 134.3 kg     Intake/Output Summary (Last 24 hours) at 02/25/2019 1305 Last data filed at 02/25/2019 1000 Gross per  24 hour  Intake 171.33 ml  Output 2260 ml  Net -2088.67 ml     Physical Exam General appearance: Patient extubated this morning.  Seems to be stable.  Not in any distress. Resp: Mildly tachypneic.  Coarse breath sounds bilaterally.  With few crackles at the bases.  No wheezing or rhonchi. Cardio: S1-S2 is tachycardic regular.  No S3-S4.  No rubs or bruit. GI: Abdomen is soft.  Nontender nondistended.  Bowel sounds are present normal.  No masses  organomegaly Extremities: No edema.  Full range of motion of lower extremities. Neurologic: Awake alert.  Following commands.  No focal neurological deficits.     Data Review:    CBC Recent Labs  Lab 02/19/19 0424  02/20/19 0345  02/21/19 0339  02/23/19 0420 02/23/19 0515 02/23/19 1155 02/24/19 0315 02/24/19 0435 02/25/19 0235  WBC 4.0  --  5.3  --  4.7  --  5.2  --   --   --  4.6 5.4  HGB 12.7   < > 12.7   < > 10.2*   < > 13.1 13.9 13.6 12.6 12.7 12.9  HCT 43.7   < > 43.3   < > 34.9*   < > 43.8 41.0 40.0 37.0 41.6 42.9  PLT 192  --  211  --  204  --  267  --   --   --  254 289  MCV 99.1  --  99.1  --  100.6*  --  99.1  --   --   --  98.8 98.8  MCH 28.8  --  29.1  --  29.4  --  29.6  --   --   --  30.2 29.7  MCHC 29.1*  --  29.3*  --  29.2*  --  29.9*  --   --   --  30.5 30.1  RDW 14.5  --  14.3  --  14.4  --  14.2  --   --   --  14.6 14.2  LYMPHSABS 0.9  --  1.1  --   --   --   --   --   --   --   --   --   MONOABS 0.3  --  0.3  --   --   --   --   --   --   --   --   --   EOSABS 0.0  --  0.1  --   --   --   --   --   --   --   --   --   BASOSABS 0.0  --  0.0  --   --   --   --   --   --   --   --   --    < > = values in this interval not displayed.    Chemistries  Recent Labs  Lab 02/19/19 0424  02/20/19 0345  02/21/19 0339  02/21/19 0530  02/22/19 0500  02/23/19 0420 02/23/19 0515 02/23/19 1155 02/24/19 0315 02/24/19 0435 02/25/19 0235  NA 145   < > 145   < > 145   < >  --    < > 144   < > 144 144 145 147* 145 145  K 3.9   < > 3.9   < > 2.7*   < >  --    < > 3.4*   < > 3.8 3.9 3.8 3.0* 2.8* 3.2*  CL 100  --  96*  --  110  --   --   --  100  --  102  --   --   --  102 100  CO2 33*  --  36*  --  27  --   --   --  33*  --  33*  --   --   --  35* 33*  GLUCOSE 115*  --  113*  --  90  --   --   --  118*  --  168*  --   --   --  158* 168*  BUN 8  --  12  --  10  --   --   --  19  --  28*  --   --   --  39* 36*  CREATININE 0.74  --  0.96  --  0.73  --   --   --  0.77   --  0.68  --   --   --  0.65 0.70  CALCIUM 7.9*  --  8.6*  --  6.8*  --   --   --  8.7*  --  8.9  --   --   --  9.0 8.7*  MG 1.9  --  1.9  --   --   --  2.0  --  2.3  --   --   --   --   --   --  2.5*  AST 31  --   --   --   --   --   --   --   --   --  27  --   --   --   --   --   ALT 25  --   --   --   --   --   --   --   --   --  32  --   --   --   --   --   ALKPHOS 101  --   --   --   --   --   --   --   --   --  108  --   --   --   --   --   BILITOT 1.0  --   --   --   --   --   --   --   --   --  0.6  --   --   --   --   --    < > = values in this interval not displayed.   ------------------------------------------------------------------------------------------------------------------ Recent Labs    02/22/19 1645  TRIG 330*       Component Value Date/Time   BNP 36.5 02/24/2019 0435    Micro Results Recent Results (from the past 240 hour(s))  MRSA PCR Screening     Status: None   Collection Time: 02/15/19  8:56 PM  Result Value Ref Range Status   MRSA by PCR NEGATIVE NEGATIVE Final    Comment:        The GeneXpert MRSA Assay (FDA approved for NASAL specimens only), is one component of a comprehensive MRSA colonization surveillance program. It is not intended to diagnose MRSA infection nor to guide or monitor treatment for MRSA infections. Performed at [facility], [address] [place], [place] [address]   Culture, blood (routine x 2)     Status: None   Collection  Time: 02/15/19  9:46 PM  Result Value Ref Range Status   Specimen Description BLOOD LEFT FOREARM  Final   Special Requests   Final    BOTTLES DRAWN AEROBIC ONLY Blood Culture adequate volume   Culture   Final    NO GROWTH 5 DAYS Performed at [facility], [address], [place], [place] [address]    Report Status 02/20/2019 FINAL  Final  Urine culture     Status: None   Collection Time: 02/15/19  9:55 PM  Result Value Ref Range Status   Specimen Description URINE, RANDOM  Final    Special Requests NONE  Final   Culture   Final    NO GROWTH Performed at [facility], [address], [place], [place] [address]    Report Status 02/16/2019 FINAL  Final  Culture, blood (routine x 2)     Status: None   Collection Time: 02/15/19  9:56 PM  Result Value Ref Range Status   Specimen Description BLOOD LEFT HAND  Final   Special Requests   Final    BOTTLES DRAWN AEROBIC AND ANAEROBIC Blood Culture adequate volume   Culture   Final    NO GROWTH 5 DAYS Performed at [facility], [address] [place], [place] [address]    Report Status 02/20/2019 FINAL  Final  Culture, respiratory (tracheal aspirate)     Status: None   Collection Time: 02/15/19 11:51 PM  Result Value Ref Range Status   Specimen Description TRACHEAL ASPIRATE  Final   Special Requests NONE  Final   Gram Stain   Final    MODERATE WBC PRESENT, PREDOMINANTLY MONONUCLEAR FEW GRAM POSITIVE RODS FEW GRAM POSITIVE COCCI    Culture   Final    RARE Consistent with normal respiratory flora. Performed at [facility], [address], [place], [place] [address]    Report Status 02/18/2019 FINAL  Final  Respiratory Panel by PCR     Status: None   Collection Time: 02/16/19  4:02 AM  Result Value Ref Range Status   Adenovirus NOT DETECTED NOT DETECTED Final   Coronavirus 229E NOT DETECTED NOT DETECTED Final    Comment: (NOTE) The Coronavirus on the Respiratory Panel, DOES NOT test for the novel  Coronavirus (2019 nCoV)    Coronavirus HKU1 NOT DETECTED NOT DETECTED Final   Coronavirus NL63 NOT DETECTED NOT DETECTED Final   Coronavirus OC43 NOT DETECTED NOT DETECTED Final   Metapneumovirus NOT DETECTED NOT DETECTED Final   Rhinovirus / Enterovirus NOT DETECTED NOT DETECTED Final   Influenza A NOT DETECTED NOT DETECTED Final   Influenza B NOT DETECTED NOT DETECTED Final   Parainfluenza Virus 1 NOT DETECTED NOT DETECTED Final   Parainfluenza Virus 2 NOT DETECTED NOT DETECTED Final    Parainfluenza Virus 3 NOT DETECTED NOT DETECTED Final   Parainfluenza Virus 4 NOT DETECTED NOT DETECTED Final   Respiratory Syncytial Virus NOT DETECTED NOT DETECTED Final   Bordetella pertussis NOT DETECTED NOT DETECTED Final   Chlamydophila pneumoniae NOT DETECTED NOT DETECTED Final   Mycoplasma pneumoniae NOT DETECTED NOT DETECTED Final    Comment: Performed at [facility], [address], [place], [place] [address]  Novel Coronavirus, NAA (hospital order; send-out to ref lab)     Status: Abnormal   Collection Time: 02/16/19  2:49 PM  Result Value Ref Range Status   SARS-CoV-2, NAA DETECTED (A) NOT DETECTED Final    Comment: Positive (  Detected) results are indicative of active infection with SARS CoV 2. A positive result does not rule out bacterial infection or coinfection with other viruses. Detection of SARS CoV 2 viral RNA may not indicate that SARS CoV 2 is the causative agent for clinical symptoms. Positive and negative predictive values of testing are highly dependent on prevalence. False positive test results are more likely when prevalence is moderate to low. RESULT CALLED TO, READ BACK BY AND VERIFIED WITH: NGilmore Laroche 0121 02/20/2019 T. TYSOR (NOTE) The expected result is Negative (Not Detected). The SARS CoV 2 test is intended for the presumptive qualitative  detection of nucleic acid from SARS CoV 2 in upper and lower  respiratory specimens. Testing methodology is real time RT PCR. Test results must be correlated with clinical presentation and  evaluated in the context of other laboratory and epidemiologic data.  Test performance can be affected because the epidemiology and  cli nical spectrum of infection caused by SARS CoV 2 is not fully  known. For example, the optimum types of specimens to collect and  when during the course of infection these specimens are most likely  to contain detectable viral RNA may not be known. This test has not been Food and Drug  Administration (FDA) cleared or  approved and has been authorized by FDA under an Emergency Use  Authorization (EUA). The test is only authorized for the duration of  the declaration that circumstances exist justifying the authorization  of emergency use of in vitro diagnostic tests for detection and or  diagnosis of SARS CoV 2 under Section 564(b)(1) of the Act, 21 U.S.C.  section [phone] 3(b)(1), unless the authorization is terminated or  revoked sooner. [facility] is certified under the  Clinical Laboratory Improvement Amendments of 1988 (CLIA), 42 U.S.C.  section [phone], to perform high complexity tests. Performed at [facility] CLIA [id number] [address], [place], [place] [address] Laboratory Director: Turner Daniels, MD Fact Sheet for Healthcare Providers  https://pope.com/ Fact Sheet for Patients  BoilerBrush.com.cy    Coronavirus Source NASOPHARYNGEAL  Final    Comment: Performed at [facility], [address], [place], [place] [address]    Radiology Reports Dg Chest 1 View  Result Date: 02/20/2019 CLINICAL DATA:  Evaluate endotracheal tube EXAM: CHEST  1 VIEW COMPARISON:  02/20/2019, 02/19/2019 FINDINGS: Endotracheal tube with the tip 5.3 cm above the carina. Nasogastric tube coursing below the diaphragm. Left jugular central venous catheter with the tip projecting over the SVC. Bilateral peripheral interstitial and alveolar airspace opacities improved compared from x-ray performed earlier same day. No pleural effusion or pneumothorax. Stable cardiomegaly. No acute osseous abnormality. IMPRESSION: 1. Support lines and tubing in satisfactory position. 2. Bilateral interstitial and alveolar airspace opacities with improved aeration compared with the most recent prior examination performed earlier same day. Electronically Signed   By: Elige Ko   On: 02/20/2019 19:32    Dg Chest Port 1 View  Result Date: 02/25/2019 CLINICAL DATA:  +COVID SOB EXAM: PORTABLE CHEST 1 VIEW COMPARISON:  02/24/2011 FINDINGS: Endotracheal tube and NG tube are unchanged. Central venous line unchanged. Stable cardiac silhouette. Low lung volumes. No focal consolidation. No pneumothorax. IMPRESSION: 1. Stable support apparatus. 2. Low lung volumes.  No focal airspace disease. Electronically Signed   By: Genevive Bi M.D.   On: 02/25/2019 07:29   Dg Chest Port 1 View  Result Date: 02/24/2019 CLINICAL DATA:  COVID-19 pneumonia.  Shortness of breath. EXAM: PORTABLE CHEST 1 VIEW COMPARISON:  One-view chest x-ray 02/23/2019 FINDINGS: Heart size is exaggerated by low lung volumes. Endotracheal tube terminates 4 cm from the carina. Left IJ line is stable. Aeration of both lung bases is slightly improved. IMPRESSION: 1. Slight improved aeration at both lung bases. 2. Support apparatus is stable. Electronically Signed   By: Marin Robertshristopher  Mattern M.D.   On: 02/24/2019 07:02   Dg Chest Port 1 View  Result Date: 02/23/2019 CLINICAL DATA:  Encounter for shortness of breath. ARDS due to COVID-19 virus. EXAM: PORTABLE CHEST 1 VIEW COMPARISON:  One-view chest x-ray 02/22/2019 FINDINGS: Heart is mildly enlarged. Endotracheal tube terminates 3.8 cm above the carina. Left IJ line is stable. NG tube courses off the inferior border the film. Progressive interstitial and airspace disease is present at both lung bases. Bilateral pleural effusions are present. IMPRESSION: 1. Increasing interstitial and airspace disease and both lung bases compatible with worsening ARDS. 2. Cardiomegaly and mild pulmonary vascular congestion. 3. Support apparatus is stable. Electronically Signed   By: Marin Robertshristopher  Mattern M.D.   On: 02/23/2019 07:46   Dg Chest Port 1 View  Result Date: 02/22/2019 CLINICAL DATA:  Acute hypoxemic respiratory failure. Endotracheal tube present. EXAM: PORTABLE CHEST 1 VIEW COMPARISON:  02/21/2019  FINDINGS: Endotracheal tube and left jugular central venous catheter remain in appropriate position. Nasogastric tube is been removed since prior exam. Patient is rotated to the right. Heart size is stable. Diffuse interstitial infiltrates show no significant change since previous study. Mild airspace opacity in the right lung base is also unchanged. No new or worsening areas of pulmonary opacity identified. IMPRESSION: No significant change in diffuse interstitial infiltrates and mild right basilar airspace opacity. Electronically Signed   By: Myles RosenthalJohn  Stahl M.D.   On: 02/22/2019 08:55   Dg Chest Port 1 View  Result Date: 02/21/2019 CLINICAL DATA:  Acute respiratory failure with hypoxemia. EXAM: PORTABLE CHEST 1 VIEW COMPARISON:  02/20/2019 FINDINGS: Patient is rotated to the right. Endotracheal tube has tip 7 cm above the carina. Enteric tube courses into the region of the stomach and off the film as tip is not visualized. Left IJ central venous catheter unchanged. Lungs are adequately inflated. Mild hazy prominence of the infrahilar vasculature likely mild vascular congestion/edema. No effusion. Stable cardiomegaly. Remainder of the exam is unchanged. IMPRESSION: Mild stable cardiomegaly with suggestion mild vascular congestion. Tubes and lines as described. Electronically Signed   By: Elberta Fortisaniel  Boyle M.D.   On: 02/21/2019 16:31   Dg Chest Port 1 View  Result Date: 02/20/2019 CLINICAL DATA:  Respiratory failure EXAM: PORTABLE CHEST 1 VIEW COMPARISON:  02/19/2019 FINDINGS: Endotracheal tube, NG tube, left jugular central venous catheter are stable. Diffuse bilateral airspace opacities are stable. Normal heart size. No pneumothorax. IMPRESSION: Stable support apparatus and diffuse bilateral airspace disease. Electronically Signed   By: Jolaine ClickArthur  Hoss M.D.   On: 02/20/2019 07:26   Dg Chest Port 1 View  Result Date: 02/19/2019 CLINICAL DATA:  Respiratory failure.  COVID-19 EXAM: PORTABLE CHEST 1 VIEW COMPARISON:   02/18/2019 FINDINGS: Prone positioning. Endotracheal tube in good position. Left jugular catheter tip in the SVC. NG in the stomach. Cardiac enlargement. Diffuse bilateral airspace disease unchanged. No pneumothorax or effusion. IMPRESSION: Diffuse bilateral airspace disease unchanged. Support lines in good position and unchanged. Electronically Signed   By: Marlan Palauharles  Clark M.D.   On: 02/19/2019 08:08   Dg Chest Port 1 View  Result Date: 02/18/2019 CLINICAL DATA:  Ventilator dependent  respiratory failure. Suspected COVID-19 infection. EXAM: PORTABLE CHEST 1 VIEW COMPARISON:  02/17/2019 and earlier. FINDINGS: Endotracheal tube tip in satisfactory position approximately 6 cm above the carina. RIGHT-sided central venous catheter crosses the midline with its tip likely in the LEFT innominate vein. Gastric tube difficult to visualize distally due to radiographic technique but likely courses below the diaphragm. Worsening airspace consolidation throughout the RIGHT lung and in the LEFT UPPER LOBE. Stable airspace consolidation in the LEFT LOWER LOBE. Stable cardiomegaly. Pulmonary venous hypertension without overt edema, unchanged. IMPRESSION: 1. Support apparatus satisfactory. 2. Worsening pneumonia throughout the RIGHT lung and in the LEFT UPPER LOBE. Stable LEFT LOWER LOBE pneumonia. 3. Stable cardiomegaly. Pulmonary venous hypertension without overt edema. Electronically Signed   By: Hulan Saas M.D.   On: 02/18/2019 08:03   Dg Chest Port 1 View  Result Date: 02/17/2019 CLINICAL DATA:  Intubation EXAM: PORTABLE CHEST 1 VIEW COMPARISON:  02/17/2019 FINDINGS: endotracheal tube is 5 cm above the carina. NG tube enters the stomach. Cardiomegaly, vascular congestion. Bilateral interstitial prominence and lower lobe airspace opacities, likely edema. Small effusions suspected. No acute bony abnormality. IMPRESSION: Endotracheal tube 5 cm above the carina. Cardiomegaly with vascular congestion, interstitial  prominence and lower lobe opacities, likely edema. Small layering effusions. Electronically Signed   By: Charlett Nose M.D.   On: 02/17/2019 16:55   Dg Chest Port 1 View  Result Date: 02/17/2019 CLINICAL DATA:  Respiratory failure. Coronavirus positive patient. The image was taken prone. EXAM: PORTABLE CHEST 1 VIEW COMPARISON:  Single-view of the chest earlier today and 02/16/2019. FINDINGS: Support tubes and lines are unchanged. Bilateral airspace disease persists without change. Heart size is appears enlarged. No pneumothorax or pleural effusion. IMPRESSION: No change in support apparatus or left worse than right airspace disease since the examination earlier today. Electronically Signed   By: Drusilla Kanner M.D.   On: 02/17/2019 11:46   Dg Chest Port 1 View  Result Date: 02/17/2019 CLINICAL DATA:  Respiratory failure. Possible COVID-19. patient in prone position. EXAM: PORTABLE CHEST 1 VIEW COMPARISON:  02/16/2019 FINDINGS: Endotracheal tube tip is 5 cm above the carina. Orogastric or nasogastric tube enters the abdomen. Left internal jugular central line tip in the SVC at the azygos level. Bilateral patchy pulmonary infiltrates persist, similar allowing for technical differences. IMPRESSION: Persistent bilateral patchy pulmonary infiltrates. Lines and tubes satisfactory. Today's study is done prone. Electronically Signed   By: Paulina Fusi M.D.   On: 02/17/2019 06:20   Dg Chest Port 1 View  Result Date: 02/16/2019 CLINICAL DATA:  Central line placement. EXAM: PORTABLE CHEST 1 VIEW COMPARISON:  Radiograph earlier this day at 0530 hour FINDINGS: Tip of the left internal jugular central venous catheter projects over the brachiocephalic/SVC confluence. No pneumothorax. Endotracheal tube tip 6.3 cm from the carina and the thoracic inlet. Enteric tube in place, not well visualized distal to the lower esophagus. Again seen cardiomegaly. No significant change in mixed interstitial and airspace opacities,  side for equivocal worsening at the left lung base. IMPRESSION: 1. Tip of the left internal jugular central venous catheter projects over the brachiocephalic/SVC confluence. No pneumothorax. 2. Unchanged cardiomegaly. Bilateral mixed interstitial and airspace opacities grossly stable, with equivocal worsening at the left lung base. Electronically Signed   By: Narda Rutherford M.D.   On: 02/16/2019 19:12   Dg Chest Port 1 View  Result Date: 02/16/2019 CLINICAL DATA:  68 year old female with respiratory failure EXAM: PORTABLE CHEST 1 VIEW COMPARISON:  02/15/2019 FINDINGS: Cardiomediastinal silhouette unchanged  in size and contour. Cardiomegaly. Endotracheal tube terminates 5.6 cm above the carina. Gastric tube terminates out of the field of view. Improving aeration of the bilateral lungs, with persisting mixed interstitial and airspace opacities and thickening of the minor fissure. No pneumothorax. No large pleural effusion. IMPRESSION: Persisting mixed interstitial and airspace opacities, with slight improved aeration from the prior plain film. Unchanged endotracheal tube and gastric tube Electronically Signed   By: Gilmer Mor D.O.   On: 02/16/2019 08:09   Dg Chest Port 1 View  Result Date: 02/15/2019 CLINICAL DATA:  Intubation. EXAM: PORTABLE CHEST 1 VIEW COMPARISON:  Radiograph earlier this day at [facility] FINDINGS: Endotracheal tube tip 5.1 cm from the carina. Enteric tube in place, tip likely below the diaphragm but not well visualized distal to the lower thorax. Progression in diffuse bilateral interstitial and patchy airspace disease from exam earlier this day. Slight cardiomegaly compared to prior. Hazy opacity at the left lung base may be developing effusion or soft tissue attenuation. No pneumothorax. IMPRESSION: 1. Endotracheal tube tip 5.1 cm from the carina. Enteric tube in place, tip likely below the diaphragm but not well visualized in the lower thorax. 2. Progression in diffuse  bilateral interstitial and patchy airspace disease from exam earlier this day, favoring multifocal pneumonia over pulmonary edema. Slight enlargement of the cardiac silhouette and possible developing left pleural effusion are new. Electronically Signed   By: Narda Rutherford M.D.   On: 02/15/2019 21:51    Osvaldo Shipper M.D on 02/25/2019 at 1:05 PM  To page go to [email or web address] - password [facility]  [facility] -  Office  [phone]

## 2019-02-25 NOTE — Progress Notes (Signed)
Assisted tele visit to patient with daughter.  Shaley Leavens Ann, RN  

## 2019-02-25 NOTE — Progress Notes (Signed)
Facetimed with patient's daughter and updated on patient's progress.  Will continue to monitor  Jaclyn Shaggy RN

## 2019-02-26 LAB — BASIC METABOLIC PANEL
Anion gap: 13 (ref 5–15)
Anion gap: 10 (ref 5–15)
BUN: 35 mg/dL — ABNORMAL HIGH (ref 8–23)
BUN: 32 mg/dL — ABNORMAL HIGH (ref 8–23)
CO2: 31 mmol/L (ref 22–32)
CO2: 33 mmol/L — ABNORMAL HIGH (ref 22–32)
Calcium: 8.8 mg/dL — ABNORMAL LOW (ref 8.9–10.3)
Calcium: 9.1 mg/dL (ref 8.9–10.3)
Chloride: 105 mmol/L (ref 98–111)
Chloride: 105 mmol/L (ref 98–111)
Creatinine, Ser: 0.59 mg/dL (ref 0.44–1.00)
Creatinine, Ser: 0.63 mg/dL (ref 0.44–1.00)
GFR calc Af Amer: >60 mL/min (ref >60)
GFR calc Af Amer: >60 mL/min (ref >60)
GFR calc non Af Amer: >60 mL/min (ref >60)
GFR calc non Af Amer: >60 mL/min (ref >60)
Glucose, Bld: 106 mg/dL — ABNORMAL HIGH (ref 70–99)
Glucose, Bld: 139 mg/dL — ABNORMAL HIGH (ref 70–99)
Potassium: 3.1 mmol/L — ABNORMAL LOW (ref 3.5–5.1)
Potassium: 3.7 mmol/L (ref 3.5–5.1)
Sodium: 149 mmol/L — ABNORMAL HIGH (ref 135–145)
Sodium: 148 mmol/L — ABNORMAL HIGH (ref 135–145)

## 2019-02-26 LAB — CBC
HCT: 42 % (ref 36.0–46.0)
Hemoglobin: 12.9 g/dL (ref 12.0–15.0)
MCH: 30.5 pg (ref 26.0–34.0)
MCHC: 30.7 g/dL (ref 30.0–36.0)
MCV: 99.3 fL (ref 80.0–100.0)
Platelets: 266 10*3/uL (ref 150–400)
RBC: 4.23 MIL/uL (ref 3.87–5.11)
RDW: 14.2 % (ref 11.5–15.5)
WBC: 5 10*3/uL (ref 4.0–10.5)
nRBC: 0 % (ref 0.0–0.2)

## 2019-02-26 LAB — MAGNESIUM: Magnesium: 2.3 mg/dL (ref 1.7–2.4)

## 2019-02-26 LAB — GLUCOSE, CAPILLARY
Glucose-Capillary: 114 mg/dL — ABNORMAL HIGH (ref 70–99)
Glucose-Capillary: 115 mg/dL — ABNORMAL HIGH (ref 70–99)
Glucose-Capillary: 118 mg/dL — ABNORMAL HIGH (ref 70–99)
Glucose-Capillary: 154 mg/dL — ABNORMAL HIGH (ref 70–99)

## 2019-02-26 MED ORDER — QUETIAPINE FUMARATE 50 MG PO TABS
50.0000 mg | ORAL_TABLET | Freq: Every day | ORAL | Status: DC
  Administered 2019-02-26 – 2019-02-28 (×3): 50 mg via ORAL
  Filled 2019-02-26 (×4): qty 1

## 2019-02-26 MED ORDER — POTASSIUM CHLORIDE CRYS ER 20 MEQ PO TBCR
40.0000 meq | EXTENDED_RELEASE_TABLET | ORAL | Status: AC
  Administered 2019-02-26 (×2): 40 meq via ORAL
  Filled 2019-02-26 (×2): qty 2

## 2019-02-26 NOTE — Progress Notes (Signed)
Rehab Admissions Coordinator Note:  Patient was screened by Clois Dupes for appropriateness for an Inpatient Acute Rehab Consult per PT recommendation. Depending on her progress. I will follow her progress to assist with planing dispo when appropriate.Clois Dupes RN MSN 02/26/2019, 9:20 AM  I can be reached at [phone].

## 2019-02-26 NOTE — Progress Notes (Signed)
ANTICOAGULATION CONSULT NOTE - Follow Up Consult  Pharmacy Consult for Lovenox Indication: VTE prophylaxis  No Known Allergies  Patient Measurements: Height: 5\' 4"  (162.6 cm) Weight: 282 lb 10.4 oz (128.2 kg) IBW/kg (Calculated) : 54.7  Vital Signs: Temp: 98.6 F (37 C) (04/19 0800) Temp Source: Oral (04/19 0800) BP: 158/80 (04/19 0900) Pulse Rate: 71 (04/19 0900)  Labs: Recent Labs    02/24/19 0435 02/25/19 0235 02/25/19 2040 02/25/19 2155 02/26/19 0310  HGB 12.7 12.9 13.6  --  12.9  HCT 41.6 42.9 40.0  --  42.0  PLT 254 289  --   --  266  CREATININE 0.65 0.70  --  0.64 0.59    Estimated Creatinine Clearance: 89.4 mL/min (by C-G formula based on SCr of 0.59 mg/dL).   Medical History: No past medical history on file.  Medications:  Medications Prior to Admission  Medication Sig Dispense Refill Last Dose  . DULoxetine (CYMBALTA) 20 MG capsule Take 20 mg by mouth daily.   unknown  . loperamide (IMODIUM) 2 MG capsule Take 2 mg by mouth as needed for diarrhea or loose stools.   unknown  . pseudoephedrine (SUDAFED) 30 MG tablet Take 30 mg by mouth every 4 (four) hours as needed for congestion.   02/15/2019 at Unknown time  . QUEtiapine (SEROQUEL) 200 MG tablet Take 200 mg by mouth daily.   unknown  . rOPINIRole (REQUIP) 2 MG tablet Take 2 mg by mouth 2 (two) times daily as needed.    unknown  . simvastatin (ZOCOR) 20 MG tablet Take 20 mg by mouth daily.   unknown   Scheduled:  . chlorhexidine  15 mL Mouth Rinse BID  . Chlorhexidine Gluconate Cloth  6 each Topical Daily  . DULoxetine  20 mg Oral Daily  . enoxaparin (LOVENOX) injection  60 mg Subcutaneous Q24H  . guaiFENesin  600 mg Oral BID  . mouth rinse  15 mL Mouth Rinse q12n4p  . polyethylene glycol  17 g Oral Daily  . QUEtiapine  50 mg Oral QHS   Infusions:  . sodium chloride 10 mL/hr at 02/21/19 1641    Assessment: Pt was tx here from [facility] for her vent management for positive COVID19. Now extubated  and remains on Lovenox for VTE prophylaxis. H/H and Plt remain wnl. SCr remains stable.   Goal of Therapy:  Anti-Xa 0.3-0.6 Monitor platelets by anticoagulation protocol: Yes   Plan:  Continue Lovenox 60mg  (0.5 mg/kg) SQ q24 hours  Pharmacy to sign off   Vinnie Level, PharmD., BCPS Clinical Pharmacist Clinical phone for 02/26/19 until 5:30pm: [phone] If after 5:30pm, please refer to [facility] for unit-specific pharmacist

## 2019-02-26 NOTE — Progress Notes (Signed)
NAME:  Sherry Becker, MRN:  [id number], DOB:  [birth year], LOS: 11 ADMISSION DATE:  02/15/2019, CONSULTATION DATE:  02/15/19 REFERRING MD:  [facility]  CHIEF COMPLAINT:  SOB   Brief History   Sherry Becker is a 68 y.o. female who was admitted 4/8 after transfer from [facility] with acute hypoxic respiratory failure requiring intubation in the setting of bilateral infiltrates.  COVID positive.      Past Medical History  No definitive history but per med list from [place], suspect combo of depression and mood disorder along with HLD.  Probable OSA.  Significant Hospital Events   4/08 Admit, intubated. 4/09 Prone  4/10 LCB 4/13 Prone 0230. Remains on ketamine, fentanyl, propofol gtt's >transition to dilaudid gtt  Consults:     Procedures:  ETT 4/8 >> 4/18  CVC 4/9 >> L Rad A Line 4/9 >>   Significant Diagnostic Tests:  CXR 4/8 >> extensive bilateral pulmonary infiltrates.  Micro Data:  Blood 4/8 >> negative Sputum 4/8 >> normal flora UC 4/8 >> negative  RVP 4/9 >> negative  COVID 4/9 >> POSITIVE  Quantiferon gold 4/9 >> negative  Antimicrobials:  Azithromycin 4/8 >> 4/14 Ceftriaxone 4/8 >> 4/14 Plaquenil 4/6 >> completed   Interim history/subjective:   Extubated yesterday, doing well.  Stood at the bedside yesterday.  Has been tolerating ice chips and water.  Objective:  Blood pressure (!) 159/84, pulse 70, temperature 98.6 F (37 C), temperature source Oral, resp. rate (!) 21, height 5\' 4"  (1.626 m), weight 128.2 kg, SpO2 93 %.        Intake/Output Summary (Last 24 hours) at 02/26/2019 0830 Last data filed at 02/26/2019 0600 Gross per 24 hour  Intake 631.94 ml  Output 1750 ml  Net -1118.06 ml   Filed Weights   02/24/19 0443 02/25/19 0500 02/26/19 0500  Weight: 133.8 kg 134.3 kg 128.2 kg    Examination:  General:  Resting comfortably in bed HENT: NCAT OP clear PULM: CTA B, normal effort CV: RRR, no mgr GI: BS+, soft, nontender MSK: normal  bulk and tone Neuro: awake, alert, no distress, MAEW     Assessment & Plan:   Acute Hypoxic Respiratory Failure secondary to SARS-CoV2 significantly improved  P: Out of bed as tolerated Titrate off O2 for SaO2 greater than 88% Aspiration precautions, speech therapy evaluation today Ice chips, sips of water for now Chest imaging as needed only at this point  Other Arterial line, foley, flexiseal out  PCCM available PRN   Best Practice:  Diet: N.p.o. for now Pain/Anxiety/Delirium protocol (if indicated): Discontinue VAP protocol (if indicated): Yes DVT prophylaxis: Lovenox GI prophylaxis: PPI. Glucose control: N/A. Mobility: Bedrest. Code Status: Full. Family Communication: per TRH.  Disposition: ICU.  Labs   CBC: Recent Labs  Lab 02/20/19 0345  02/21/19 0339  02/23/19 0420  02/24/19 0315 02/24/19 0435 02/25/19 0235 02/25/19 2040 02/26/19 0310  WBC 5.3  --  4.7  --  5.2  --   --  4.6 5.4  --  5.0  NEUTROABS 3.7  --   --   --   --   --   --   --   --   --   --   HGB 12.7   < > 10.2*   < > 13.1   < > 12.6 12.7 12.9 13.6 12.9  HCT 43.3   < > 34.9*   < > 43.8   < > 37.0 41.6 42.9 40.0 42.0  MCV 99.1  --  100.6*  --  99.1  --   --  98.8 98.8  --  99.3  PLT 211  --  204  --  267  --   --  254 289  --  266   < > = values in this interval not displayed.   Basic Metabolic Panel: Recent Labs  Lab 02/20/19 0345  02/21/19 0530  02/22/19 0500  02/23/19 0420  02/24/19 0435 02/25/19 0235 02/25/19 2040 02/25/19 2155 02/26/19 0310  NA 145   < >  --    < > 144   < > 144   < > 145 145 148* 147* 149*  K 3.9   < >  --    < > 3.4*   < > 3.8   < > 2.8* 3.2* 3.3* 2.8* 3.1*  CL 96*   < >  --   --  100  --  102  --  102 100  --  103 105  CO2 36*   < >  --   --  33*  --  33*  --  35* 33*  --  35* 31  GLUCOSE 113*   < >  --   --  118*  --  168*  --  158* 168*  --  123* 106*  BUN 12   < >  --   --  19  --  28*  --  39* 36*  --  35* 35*  CREATININE 0.96   < >  --   --  0.77   --  0.68  --  0.65 0.70  --  0.64 0.59  CALCIUM 8.6*   < >  --   --  8.7*  --  8.9  --  9.0 8.7*  --  8.9 8.8*  MG 1.9  --  2.0  --  2.3  --   --   --   --  2.5*  --   --  2.3  PHOS 4.6  --   --   --   --   --   --   --   --   --   --   --   --    < > = values in this interval not displayed.   GFR: Estimated Creatinine Clearance: 89.4 mL/min (by C-G formula based on SCr of 0.59 mg/dL). Recent Labs  Lab 02/20/19 0345  02/23/19 0420 02/24/19 0435 02/25/19 0235 02/26/19 0310  PROCALCITON <0.10  --   --   --   --   --   WBC 5.3   < > 5.2 4.6 5.4 5.0   < > = values in this interval not displayed.   Liver Function Tests: Recent Labs  Lab 02/23/19 0420  AST 27  ALT 32  ALKPHOS 108  BILITOT 0.6  PROT 6.6  ALBUMIN 3.5   No results for input(s): LIPASE, AMYLASE in the last 168 hours. No results for input(s): AMMONIA in the last 168 hours.   ABG    Component Value Date/Time   PHART 7.488 (H) 02/25/2019 2040   PCO2ART 50.7 (H) 02/25/2019 2040   PO2ART 77.0 (L) 02/25/2019 2040   HCO3 38.4 (H) 02/25/2019 2040   TCO2 40 (H) 02/25/2019 2040   O2SAT 96.0 02/25/2019 2040    Coagulation Profile: No results for input(s): INR, PROTIME in the last 168 hours. Cardiac Enzymes: No results for input(s): CKTOTAL, CKMB, CKMBINDEX, TROPONINI in the last 168 hours. HbA1C: No results found for:  HGBA1C CBG: Recent Labs  Lab 02/25/19 1217 02/25/19 1619 02/25/19 1953 02/25/19 2353 02/26/19 0818  GLUCAP 132* 119* 107* 115* 118*     Heber Copan, MD [place] [facility] Pager: [phone] Cell: [phone] If no response, call [phone]

## 2019-02-26 NOTE — Progress Notes (Signed)
PROGRESS NOTE                                                                                                                                                                                                             Patient Demographics:    Sherry Becker, is a 68 y.o. female, DOB - 1951-08-04, ZOX:[id number]  Outpatient Primary MD for the patient is Patient, No Pcp Per    LOS - 11  Brief Narrative: Patient is a 68 y.o. female with PMHx of depression, probable OSA-who presented from [facility] as a transfer on 4/8 with acute hypoxic respiratory failure secondary to COVID 19 viral pneumonia.   Significant events: 4/08 Admit, intubated. 4/8>>4/13 Plaquenil 4/9 Actemra 4/09 Prone  4/10 LCB 4/13 Prone 0230. Remains on ketamine, fentanyl, propofol gtt's >transition to dilaudid gtt 4/14 Transfer to [facility] 4/14 Worsening Hypoxia/near resp arrest due to mucus plugging/proned 4/18 Extubated    Subjective:   Patient had an uneventful night.  She states that she is feeling better.  Denies any shortness of breath this morning.  Occasional cough.     Assessment  & Plan :    Acute hypoxic respiratory failure secondary to ARDS due to COVID-19 viral pneumonia:  Patient was intubated due to the respiratory failure and was on mechanical ventilation.  Patient was given Actemra x2 and completed course of hydroxychloroquine.  She also developed cord edema and was given Decadron x4 doses.  Patient has responded well to these treatments.  She was extubated on 4/18.  She continues to do well.  Currently on 5 L of oxygen by nasal cannula.  PT and OT evaluation.  Speech therapy is following so that diet can be initiated.    Fever  She developed fever on 4/17 and morning of 4/18.  Has been afebrile since then.  UA did not show any evidence for infection.  Blood cultures were sent.  WBC remains normal.  Continue to monitor her off of  antibiotics.   Hypokalemia This was repleted yesterday.  Noted to be 3.1 this morning.  She will be given more potassium today.  Recheck labs later today.  Magnesium 2.3.    Hypotension Resolved-possibly related to sedative agents.  Bradycardia Resolved-thought to be related to sedative agents  Depression/anxiety Cut back on sedative agents.  Cymbalta can be resumed when she is  able to take orally.    Morbid obesity Body mass index is 48.52 kg/m.  Vent Settings: Extubated on 4/18  Code Status : Partial code Diet : As per speech therapy  Disposition Plan  : If she tolerates oral intake without any difficulty she can be moved to progressive care unit today.  Consults  :  PCCM  Procedures  :   ETT 4/8>>4/18 CVC/left IJ 4/9>>  GI prophylaxis: PPI  DVT Prophylaxis  :  Lovenox  Lab Results  Component Value Date   PLT 266 02/26/2019    Inpatient Medications  Scheduled Meds:  chlorhexidine  15 mL Mouth Rinse BID   Chlorhexidine Gluconate Cloth  6 each Topical Daily   DULoxetine  20 mg Oral Daily   enoxaparin (LOVENOX) injection  60 mg Subcutaneous Q24H   guaiFENesin  600 mg Oral BID   mouth rinse  15 mL Mouth Rinse q12n4p   polyethylene glycol  17 g Oral Daily   QUEtiapine  50 mg Oral QHS   Continuous Infusions:  sodium chloride 10 mL/hr at 02/21/19 1641   PRN Meds:.Place/Maintain arterial line **AND** sodium chloride, acetaminophen, bisacodyl, hydrALAZINE, ipratropium-albuterol, labetalol, lip balm  Antibiotics  :    Anti-infectives (From admission, onward)   Start     Dose/Rate Route Frequency Ordered Stop   02/16/19 2200  hydroxychloroquine (PLAQUENIL) tablet 200 mg     200 mg Per Tube 2 times daily 02/15/19 2051 02/20/19 0908   02/15/19 2200  hydroxychloroquine (PLAQUENIL) tablet 400 mg     400 mg Per Tube 2 times daily 02/15/19 2051 02/16/19 2159   02/15/19 2130  azithromycin (ZITHROMAX) 500 mg in sodium chloride 0.9 % 250 mL IVPB  Status:   Discontinued     500 mg 250 mL/hr over 60 Minutes Intravenous Daily at bedtime 02/15/19 2051 02/20/19 0923   02/15/19 2130  cefTRIAXone (ROCEPHIN) 2 g in sodium chloride 0.9 % 100 mL IVPB  Status:  Discontinued     2 g 200 mL/hr over 30 Minutes Intravenous Daily at bedtime 02/15/19 2057 02/20/19 0923   02/15/19 2100  cefTRIAXone (ROCEPHIN) injection 1 g  Status:  Discontinued     1 g Intramuscular Every 24 hours 02/15/19 2051 02/15/19 2057           Objective:   Vitals:   02/26/19 0800 02/26/19 0900 02/26/19 1000 02/26/19 1100  BP: (!) 159/84 (!) 158/80 (!) 170/95 (!) 158/79  Pulse: 70 71 71 72  Resp: (!) 21 (!) 22 (!) 22 (!) 25  Temp: 98.6 F (37 C)     TempSrc: Oral     SpO2: 93% 92% 90% 91%  Weight:      Height:        Wt Readings from Last 3 Encounters:  02/26/19 128.2 kg     Intake/Output Summary (Last 24 hours) at 02/26/2019 1215 Last data filed at 02/26/2019 1100 Gross per 24 hour  Intake 475.61 ml  Output 1725 ml  Net -1249.39 ml     Physical Exam  General appearance: Awake alert.  In no distress.  Slow to respond at times.  Denies any complaints. Resp: Normal effort at rest.  Coarse breath sounds bilaterally.  Few crackles at the bases.  No wheezing or rhonchi.   Cardio: S1-S2 is normal regular.  No S3-S4.  No rubs murmurs or bruit GI: Abdomen is soft.  Nontender nondistended.  Bowel sounds are present normal.  No masses organomegaly Extremities: No edema.   Neurologic: Somewhat  slow to respond but noted to be oriented x3.  No obvious focal neurological deficits.      Data Review:    CBC Recent Labs  Lab 02/20/19 0345  02/21/19 0339  02/23/19 0420  02/24/19 0315 02/24/19 0435 02/25/19 0235 02/25/19 2040 02/26/19 0310  WBC 5.3  --  4.7  --  5.2  --   --  4.6 5.4  --  5.0  HGB 12.7   < > 10.2*   < > 13.1   < > 12.6 12.7 12.9 13.6 12.9  HCT 43.3   < > 34.9*   < > 43.8   < > 37.0 41.6 42.9 40.0 42.0  PLT 211  --  204  --  267  --   --  254 289   --  266  MCV 99.1  --  100.6*  --  99.1  --   --  98.8 98.8  --  99.3  MCH 29.1  --  29.4  --  29.6  --   --  30.2 29.7  --  30.5  MCHC 29.3*  --  29.2*  --  29.9*  --   --  30.5 30.1  --  30.7  RDW 14.3  --  14.4  --  14.2  --   --  14.6 14.2  --  14.2  LYMPHSABS 1.1  --   --   --   --   --   --   --   --   --   --   MONOABS 0.3  --   --   --   --   --   --   --   --   --   --   EOSABS 0.1  --   --   --   --   --   --   --   --   --   --   BASOSABS 0.0  --   --   --   --   --   --   --   --   --   --    < > = values in this interval not displayed.    Chemistries  Recent Labs  Lab 02/20/19 0345  02/21/19 0530  02/22/19 0500  02/23/19 0420  02/24/19 0435 02/25/19 0235 02/25/19 2040 02/25/19 2155 02/26/19 0310  NA 145   < >  --    < > 144   < > 144   < > 145 145 148* 147* 149*  K 3.9   < >  --    < > 3.4*   < > 3.8   < > 2.8* 3.2* 3.3* 2.8* 3.1*  CL 96*   < >  --   --  100  --  102  --  102 100  --  103 105  CO2 36*   < >  --   --  33*  --  33*  --  35* 33*  --  35* 31  GLUCOSE 113*   < >  --   --  118*  --  168*  --  158* 168*  --  123* 106*  BUN 12   < >  --   --  19  --  28*  --  39* 36*  --  35* 35*  CREATININE 0.96   < >  --   --  0.77  --  0.68  --  0.65 0.70  --  0.64 0.59  CALCIUM 8.6*   < >  --   --  8.7*  --  8.9  --  9.0 8.7*  --  8.9 8.8*  MG 1.9  --  2.0  --  2.3  --   --   --   --  2.5*  --   --  2.3  AST  --   --   --   --   --   --  27  --   --   --   --   --   --   ALT  --   --   --   --   --   --  32  --   --   --   --   --   --   ALKPHOS  --   --   --   --   --   --  108  --   --   --   --   --   --   BILITOT  --   --   --   --   --   --  0.6  --   --   --   --   --   --    < > = values in this interval not displayed.   ------------------------------------------------------------------------------------------------------------------ No results for input(s): CHOL, HDL, LDLCALC, TRIG, CHOLHDL, LDLDIRECT in the last 72 hours.     Component Value Date/Time    BNP 36.5 02/24/2019 0435    Micro Results Recent Results (from the past 240 hour(s))  Novel Coronavirus, NAA (hospital order; send-out to ref lab)     Status: Abnormal   Collection Time: 02/16/19  2:49 PM  Result Value Ref Range Status   SARS-CoV-2, NAA DETECTED (A) NOT DETECTED Final    Comment: Positive (Detected) results are indicative of active infection with SARS CoV 2. A positive result does not rule out bacterial infection or coinfection with other viruses. Detection of SARS CoV 2 viral RNA may not indicate that SARS CoV 2 is the causative agent for clinical symptoms. Positive and negative predictive values of testing are highly dependent on prevalence. False positive test results are more likely when prevalence is moderate to low. RESULT CALLED TO, READ BACK BY AND VERIFIED WITH: NGilmore Laroche 0121 02/20/2019 T. TYSOR (NOTE) The expected result is Negative (Not Detected). The SARS CoV 2 test is intended for the presumptive qualitative  detection of nucleic acid from SARS CoV 2 in upper and lower  respiratory specimens. Testing methodology is real time RT PCR. Test results must be correlated with clinical presentation and  evaluated in the context of other laboratory and epidemiologic data.  Test performance can be affected because the epidemiology and  cli nical spectrum of infection caused by SARS CoV 2 is not fully  known. For example, the optimum types of specimens to collect and  when during the course of infection these specimens are most likely  to contain detectable viral RNA may not be known. This test has not been Food and Drug Administration (FDA) cleared or  approved and has been authorized by FDA under an Emergency Use  Authorization (EUA). The test is only authorized for the duration of  the declaration that circumstances exist justifying the authorization  of emergency use of in vitro diagnostic tests for detection and or  diagnosis of SARS CoV 2 under Section  564(b)(1) of the Act, 21 U.S.C.  section [phone] 3(b)(1), unless  the authorization is terminated or  revoked sooner. [facility] is certified under the  Clinical Laboratory Improvement Amendments of 1988 (CLIA), 42 U.S.C.  section [phone], to perform high complexity tests. Performed at [facility] CLIA [id number] [address], [address], [place], [place] [address] Laboratory Director: Turner Daniels, MD Fact Sheet for Healthcare Providers  https://pope.com/ Fact Sheet for Patients  BoilerBrush.com.cy    Coronavirus Source NASOPHARYNGEAL  Final    Comment: Performed at [facility], [address], [place], [place] [address]  Culture, blood (routine x 2)     Status: None (Preliminary result)   Collection Time: 02/24/19  3:19 PM  Result Value Ref Range Status   Specimen Description   Final    BLOOD RIGHT HAND Performed at [facility], [address] [place], [place] [address]    Special Requests   Final    BOTTLES DRAWN AEROBIC AND ANAEROBIC Blood Culture adequate volume Performed at [facility], [address] [place], [place] [address]    Culture   Final    NO GROWTH 2 DAYS Performed at [facility], [address] [place], [place] [address]    Report Status PENDING  Incomplete  Culture, Urine     Status: None   Collection Time: 02/24/19  3:19 PM  Result Value Ref Range Status   Specimen Description   Final    URINE, RANDOM Performed at [facility], [address] [place], [place] [address]    Special Requests   Final    NONE Performed at [facility], [address], [place], [place] [address]    Culture   Final    NO GROWTH Performed at [facility], [address] [place], [place] [address]    Report Status 02/25/2019 FINAL  Final    Radiology Reports Dg Chest 1  View  Result Date: 02/20/2019 CLINICAL DATA:  Evaluate endotracheal tube EXAM: CHEST  1 VIEW COMPARISON:  02/20/2019, 02/19/2019 FINDINGS: Endotracheal tube with the tip 5.3 cm above the carina. Nasogastric tube coursing below the diaphragm. Left jugular central venous catheter with the tip projecting over the SVC. Bilateral peripheral interstitial and alveolar airspace opacities improved compared from x-ray performed earlier same day. No pleural effusion or pneumothorax. Stable cardiomegaly. No acute osseous abnormality. IMPRESSION: 1. Support lines and tubing in satisfactory position. 2. Bilateral interstitial and alveolar airspace opacities with improved aeration compared with the most recent prior examination performed earlier same day. Electronically Signed   By: Elige Ko   On: 02/20/2019 19:32   Dg Chest Port 1 View  Result Date: 02/25/2019 CLINICAL DATA:  +COVID SOB EXAM: PORTABLE CHEST 1 VIEW COMPARISON:  02/24/2011 FINDINGS: Endotracheal tube and NG tube are unchanged. Central venous line unchanged. Stable cardiac silhouette. Low lung volumes. No focal consolidation. No pneumothorax. IMPRESSION: 1. Stable support apparatus. 2. Low lung volumes.  No focal airspace disease. Electronically Signed   By: Genevive Bi M.D.   On: 02/25/2019 07:29   Dg Chest Port 1 View  Result Date: 02/24/2019 CLINICAL DATA:  COVID-19 pneumonia.  Shortness of breath. EXAM: PORTABLE CHEST 1 VIEW COMPARISON:  One-view chest x-ray 02/23/2019 FINDINGS: Heart size is exaggerated by low lung volumes. Endotracheal tube terminates 4 cm from the carina. Left IJ line is stable. Aeration of both lung bases is slightly improved. IMPRESSION: 1. Slight improved aeration at both lung bases. 2. Support  apparatus is stable. Electronically Signed   By: Marin Roberts M.D.   On: 02/24/2019 07:02   Dg Chest Port 1 View  Result Date: 02/23/2019 CLINICAL DATA:  Encounter for shortness of breath. ARDS due to COVID-19 virus.  EXAM: PORTABLE CHEST 1 VIEW COMPARISON:  One-view chest x-ray 02/22/2019 FINDINGS: Heart is mildly enlarged. Endotracheal tube terminates 3.8 cm above the carina. Left IJ line is stable. NG tube courses off the inferior border the film. Progressive interstitial and airspace disease is present at both lung bases. Bilateral pleural effusions are present. IMPRESSION: 1. Increasing interstitial and airspace disease and both lung bases compatible with worsening ARDS. 2. Cardiomegaly and mild pulmonary vascular congestion. 3. Support apparatus is stable. Electronically Signed   By: Marin Roberts M.D.   On: 02/23/2019 07:46   Dg Chest Port 1 View  Result Date: 02/22/2019 CLINICAL DATA:  Acute hypoxemic respiratory failure. Endotracheal tube present. EXAM: PORTABLE CHEST 1 VIEW COMPARISON:  02/21/2019 FINDINGS: Endotracheal tube and left jugular central venous catheter remain in appropriate position. Nasogastric tube is been removed since prior exam. Patient is rotated to the right. Heart size is stable. Diffuse interstitial infiltrates show no significant change since previous study. Mild airspace opacity in the right lung base is also unchanged. No new or worsening areas of pulmonary opacity identified. IMPRESSION: No significant change in diffuse interstitial infiltrates and mild right basilar airspace opacity. Electronically Signed   By: Myles Rosenthal M.D.   On: 02/22/2019 08:55   Dg Chest Port 1 View  Result Date: 02/21/2019 CLINICAL DATA:  Acute respiratory failure with hypoxemia. EXAM: PORTABLE CHEST 1 VIEW COMPARISON:  02/20/2019 FINDINGS: Patient is rotated to the right. Endotracheal tube has tip 7 cm above the carina. Enteric tube courses into the region of the stomach and off the film as tip is not visualized. Left IJ central venous catheter unchanged. Lungs are adequately inflated. Mild hazy prominence of the infrahilar vasculature likely mild vascular congestion/edema. No effusion. Stable  cardiomegaly. Remainder of the exam is unchanged. IMPRESSION: Mild stable cardiomegaly with suggestion mild vascular congestion. Tubes and lines as described. Electronically Signed   By: Elberta Fortis M.D.   On: 02/21/2019 16:31   Dg Chest Port 1 View  Result Date: 02/20/2019 CLINICAL DATA:  Respiratory failure EXAM: PORTABLE CHEST 1 VIEW COMPARISON:  02/19/2019 FINDINGS: Endotracheal tube, NG tube, left jugular central venous catheter are stable. Diffuse bilateral airspace opacities are stable. Normal heart size. No pneumothorax. IMPRESSION: Stable support apparatus and diffuse bilateral airspace disease. Electronically Signed   By: Jolaine Click M.D.   On: 02/20/2019 07:26   Dg Chest Port 1 View  Result Date: 02/19/2019 CLINICAL DATA:  Respiratory failure.  COVID-19 EXAM: PORTABLE CHEST 1 VIEW COMPARISON:  02/18/2019 FINDINGS: Prone positioning. Endotracheal tube in good position. Left jugular catheter tip in the SVC. NG in the stomach. Cardiac enlargement. Diffuse bilateral airspace disease unchanged. No pneumothorax or effusion. IMPRESSION: Diffuse bilateral airspace disease unchanged. Support lines in good position and unchanged. Electronically Signed   By: Marlan Palau M.D.   On: 02/19/2019 08:08   Dg Chest Port 1 View  Result Date: 02/18/2019 CLINICAL DATA:  Ventilator dependent respiratory failure. Suspected COVID-19 infection. EXAM: PORTABLE CHEST 1 VIEW COMPARISON:  02/17/2019 and earlier. FINDINGS: Endotracheal tube tip in satisfactory position approximately 6 cm above the carina. RIGHT-sided central venous catheter crosses the midline with its tip likely in the LEFT innominate vein. Gastric tube difficult to visualize distally due to radiographic technique but  likely courses below the diaphragm. Worsening airspace consolidation throughout the RIGHT lung and in the LEFT UPPER LOBE. Stable airspace consolidation in the LEFT LOWER LOBE. Stable cardiomegaly. Pulmonary venous hypertension without  overt edema, unchanged. IMPRESSION: 1. Support apparatus satisfactory. 2. Worsening pneumonia throughout the RIGHT lung and in the LEFT UPPER LOBE. Stable LEFT LOWER LOBE pneumonia. 3. Stable cardiomegaly. Pulmonary venous hypertension without overt edema. Electronically Signed   By: Hulan Saas M.D.   On: 02/18/2019 08:03   Dg Chest Port 1 View  Result Date: 02/17/2019 CLINICAL DATA:  Intubation EXAM: PORTABLE CHEST 1 VIEW COMPARISON:  02/17/2019 FINDINGS: endotracheal tube is 5 cm above the carina. NG tube enters the stomach. Cardiomegaly, vascular congestion. Bilateral interstitial prominence and lower lobe airspace opacities, likely edema. Small effusions suspected. No acute bony abnormality. IMPRESSION: Endotracheal tube 5 cm above the carina. Cardiomegaly with vascular congestion, interstitial prominence and lower lobe opacities, likely edema. Small layering effusions. Electronically Signed   By: Charlett Nose M.D.   On: 02/17/2019 16:55   Dg Chest Port 1 View  Result Date: 02/17/2019 CLINICAL DATA:  Respiratory failure. Coronavirus positive patient. The image was taken prone. EXAM: PORTABLE CHEST 1 VIEW COMPARISON:  Single-view of the chest earlier today and 02/16/2019. FINDINGS: Support tubes and lines are unchanged. Bilateral airspace disease persists without change. Heart size is appears enlarged. No pneumothorax or pleural effusion. IMPRESSION: No change in support apparatus or left worse than right airspace disease since the examination earlier today. Electronically Signed   By: Drusilla Kanner M.D.   On: 02/17/2019 11:46   Dg Chest Port 1 View  Result Date: 02/17/2019 CLINICAL DATA:  Respiratory failure. Possible COVID-19. patient in prone position. EXAM: PORTABLE CHEST 1 VIEW COMPARISON:  02/16/2019 FINDINGS: Endotracheal tube tip is 5 cm above the carina. Orogastric or nasogastric tube enters the abdomen. Left internal jugular central line tip in the SVC at the azygos level. Bilateral  patchy pulmonary infiltrates persist, similar allowing for technical differences. IMPRESSION: Persistent bilateral patchy pulmonary infiltrates. Lines and tubes satisfactory. Today's study is done prone. Electronically Signed   By: Paulina Fusi M.D.   On: 02/17/2019 06:20   Dg Chest Port 1 View  Result Date: 02/16/2019 CLINICAL DATA:  Central line placement. EXAM: PORTABLE CHEST 1 VIEW COMPARISON:  Radiograph earlier this day at 0530 hour FINDINGS: Tip of the left internal jugular central venous catheter projects over the brachiocephalic/SVC confluence. No pneumothorax. Endotracheal tube tip 6.3 cm from the carina and the thoracic inlet. Enteric tube in place, not well visualized distal to the lower esophagus. Again seen cardiomegaly. No significant change in mixed interstitial and airspace opacities, side for equivocal worsening at the left lung base. IMPRESSION: 1. Tip of the left internal jugular central venous catheter projects over the brachiocephalic/SVC confluence. No pneumothorax. 2. Unchanged cardiomegaly. Bilateral mixed interstitial and airspace opacities grossly stable, with equivocal worsening at the left lung base. Electronically Signed   By: Narda Rutherford M.D.   On: 02/16/2019 19:12   Dg Chest Port 1 View  Result Date: 02/16/2019 CLINICAL DATA:  68 year old female with respiratory failure EXAM: PORTABLE CHEST 1 VIEW COMPARISON:  02/15/2019 FINDINGS: Cardiomediastinal silhouette unchanged in size and contour. Cardiomegaly. Endotracheal tube terminates 5.6 cm above the carina. Gastric tube terminates out of the field of view. Improving aeration of the bilateral lungs, with persisting mixed interstitial and airspace opacities and thickening of the minor fissure. No pneumothorax. No large pleural effusion. IMPRESSION: Persisting mixed interstitial and airspace opacities, with  slight improved aeration from the prior plain film. Unchanged endotracheal tube and gastric tube Electronically Signed    By: Gilmer MorJaime  Wagner D.O.   On: 02/16/2019 08:09   Dg Chest Port 1 View  Result Date: 02/15/2019 CLINICAL DATA:  Intubation. EXAM: PORTABLE CHEST 1 VIEW COMPARISON:  [facility] FINDINGS: Endotracheal tube tip 5.1 cm from the carina. Enteric tube in place, tip likely below the diaphragm but not well visualized distal to the lower thorax. Progression in diffuse bilateral interstitial and patchy airspace disease from exam earlier this day. Slight cardiomegaly compared to prior. Hazy opacity at the left lung base may be developing effusion or soft tissue attenuation. No pneumothorax. IMPRESSION: 1. Endotracheal tube tip 5.1 cm from the carina. Enteric tube in place, tip likely below the diaphragm but not well visualized in the lower thorax. 2. Progression in diffuse bilateral interstitial and patchy airspace disease from exam earlier this day, favoring multifocal pneumonia over pulmonary edema. Slight enlargement of the cardiac silhouette and possible developing left pleural effusion are new. Electronically Signed   By: Narda RutherfordMelanie  Sanford M.D.   On: 02/15/2019 21:51    Osvaldo ShipperGokul Trevin Gartrell M.D   To page go to [email or web address] - password [email or web address]  [facility] -  Office  [phone]  02/26/2019

## 2019-02-26 NOTE — Evaluation (Signed)
Clinical/Bedside Swallow Evaluation Patient Details  Name: Sherry Becker MRN: [id number] Date of Birth: 30-Aug-1951  Today's Date: 02/26/2019 Time: SLP Start Time (ACUTE ONLY): 1648 SLP Stop Time (ACUTE ONLY): 1656 SLP Time Calculation (min) (ACUTE ONLY): 8 min  Past Medical History: No past medical history on file. Past Surgical History:  HPI:   68 y.o. female who was admitted 4/8 after transfer from [facility] with acute hypoxic respiratory failure requiring intubation in the setting of bilateral infiltrates.  COVID positive.   ETT 4/09-4/18.   Assessment / Plan / Recommendation Clinical Impression  Pt demonstrates normal swallow function. Three oz water swallow completed x3 with no adverse result. Pt has been tolerating sips all day. Mastication WNL. Recommend initiate a regular diet and thin liquids. No SLP f/u needed will sign off.  SLP Visit Diagnosis: Dysphagia, oropharyngeal phase (R13.12)    Aspiration Risk  Mild aspiration risk    Diet Recommendation Regular;Thin liquid   Liquid Administration via: Cup;Straw Medication Administration: Whole meds with liquid Supervision: Patient able to self feed    Other  Recommendations     Follow up Recommendations None      Frequency and Duration            Prognosis        Swallow Study   General HPI:  68 y.o. female who was admitted 4/8 after transfer from [facility] with acute hypoxic respiratory failure requiring intubation in the setting of bilateral infiltrates.  COVID positive.   ETT 4/09-4/18. Type of Study: Bedside Swallow Evaluation Diet Prior to this Study: NPO Respiratory Status: Nasal cannula History of Recent Intubation: Yes Length of Intubations (days): 10 days Date extubated: 02/25/19 Behavior/Cognition: Alert;Cooperative;Pleasant mood Oral Cavity Assessment: Within Functional Limits Oral Care Completed by SLP: No Oral Cavity - Dentition: Adequate natural dentition Vision: Functional for  self-feeding Self-Feeding Abilities: Total assist Patient Positioning: Partially reclined Baseline Vocal Quality: Normal Volitional Cough: Strong Volitional Swallow: Able to elicit    Oral/Motor/Sensory Function Overall Oral Motor/Sensory Function: Within functional limits   Ice Chips     Thin Liquid Thin Liquid: Within functional limits Presentation: Straw    Nectar Thick Nectar Thick Liquid: Not tested   Honey Thick Honey Thick Liquid: Not tested   Puree Puree: Within functional limits   Solid     Solid: Within functional limits     Sherry Ditty, MA CCC-SLP  Acute Rehabilitation Services Pager [phone] Office [phone]  Sherry Becker 02/26/2019,4:59 PM

## 2019-02-26 NOTE — Progress Notes (Signed)
Pt tolerating ice chips really well. MD changed meds to po and pt swallowed all po meds without any difficulty. Removed central line and she tolerated that. She keeps asking when arterial line is coming out. She rested somewhat well.

## 2019-02-26 NOTE — Care Management (Signed)
Case manager continuing to monitor patient for disposition as she continues to improve medically. May God bless her to continue recovering.     Vance Peper, RN Case Manager [phone]

## 2019-02-26 NOTE — Progress Notes (Signed)
Pulled pt's AM dose of Seroquel and brought in to her contact room. Order was then changed to give at night. Due to contact precautions, unable to return to the pyxis. Put in sharps contatiner witnessed by [employer], Charity fundraiser.

## 2019-02-26 NOTE — Progress Notes (Signed)
At 1020, called daughter, Arlyss Repress, to update her on the status of her mother. Answered all her questions to the best of my ability.

## 2019-02-26 NOTE — Evaluation (Signed)
Occupational Therapy Evaluation Patient Details Name: Sherry KeasCatherine Schweitzer MRN: [id number] DOB: 09/07/1951 Today's Date: 02/26/2019    History of Present Illness 68 y.o. female who was admitted 4/8 after [facility]  to [facility] with acute hypoxic respiratory failure requiring intubation in the setting of bilateral infiltrates. COVID positive; 4/14 Transfer to [facility]; ETT 4/8 >> 4/18.   PMHx: mood disorder, depression, obesity   Clinical Impression   PTA,  Pt lived alone and was independent with ADL and mobility and worked full time. Pt currently requires mod A with mobility. Min A with UB ADL and Max A with LB ADL @ RW level due to deficits listed below. Pt very motivated to return to independence. Feel pt would benefit from CIR to facilitate safe return home. Pt states daughter will be able to assist after DC. Will follow acutely.     Follow Up Recommendations  CIR;Supervision/Assistance - 24 hour    Equipment Recommendations  3 in 1 bedside commode    Recommendations for Other Services Rehab consult     Precautions / Restrictions Precautions Precautions: Fall      Mobility Bed Mobility Overal bed mobility: Needs Assistance       Supine to sit: Mod assist     General bed mobility comments: A to move legs off bed and transition trunk upright  Transfers Overall transfer level: Needs assistance Equipment used: Rolling walker (2 wheeled) Transfers: Sit to/from [employer]Stand;Stand Pivot Transfers Sit to Stand: Mod assist Stand pivot transfers: Mod assist       General transfer comment: unsafe descent to chair    Balance     Sitting balance-Leahy Scale: Fair       Standing balance-Leahy Scale: Poor Standing balance comment: reliant on UEs and external assist                           ADL either performed or assessed with clinical judgement   ADL Overall ADL's : Needs assistance/impaired Eating/Feeding: Set up   Grooming: Set up;Sitting    Upper Body Bathing: Set up;Sitting   Lower Body Bathing: Moderate assistance;Sit to/from stand   Upper Body Dressing : Minimal assistance;Sitting   Lower Body Dressing: Sit to/from stand;Maximal assistance   Toilet Transfer: Moderate assistance;RW;Stand-pivot   Toileting- Clothing Manipulation and Hygiene: Maximal assistance Toileting - Clothing Manipulation Details (indicate cue type and reason): rectal foley     Functional mobility during ADLs: Moderate assistance;Rolling walker;Cueing for safety;Cueing for sequencing       Vision         Perception     Praxis      Pertinent Vitals/Pain Pain Assessment: No/denies pain     Hand Dominance Right   Extremity/Trunk Assessment Upper Extremity Assessment Upper Extremity Assessment: Generalized weakness   Lower Extremity Assessment Lower Extremity Assessment: Defer to PT evaluation   Cervical / Trunk Assessment Cervical / Trunk Assessment: Other exceptions(increased body habitus)   Communication Communication Communication: No difficulties   Cognition Arousal/Alertness: Awake/alert Behavior During Therapy: WFL for tasks assessed/performed Overall Cognitive Status: No family/caregiver present to determine baseline cognitive functioning                                 General Comments: Appears returning to baseline. Will further assess. Pt discussing how she remembers ICU, being on the vent and how scary it was. Listening provided.    General Comments  Exercises Exercises: Other exercises Other Exercises Other Exercises: general BUE HEP Other Exercises: Incentive spirometer   Shoulder Instructions      Home Living Family/patient expects to be discharged to:: Private residence Living Arrangements: Alone Available Help at Discharge: Family;Available 24 hours/day Type of Home: House       Home Layout: One level     Bathroom Shower/Tub: Producer, television/film/video:  Standard Bathroom Accessibility: Yes How Accessible: Accessible via walker Home Equipment: Shower seat          Prior Functioning/Environment Level of Independence: Independent        Comments: works full time        OT Problem List: Decreased strength;Decreased activity tolerance;Impaired balance (sitting and/or standing);Decreased safety awareness;Decreased knowledge of use of DME or AE;Cardiopulmonary status limiting activity;Obesity      OT Treatment/Interventions: Self-care/ADL training;Neuromuscular education;Energy conservation;DME and/or AE instruction;Therapeutic activities;Cognitive remediation/compensation;Patient/family education;Balance training    OT Goals(Current goals can be found in the care plan section) Acute Rehab OT Goals Patient Stated Goal: to be independent and get back to work OT Goal Formulation: With patient Time For Goal Achievement: 03/12/19 Potential to Achieve Goals: Good  OT Frequency: Min 3X/week   Barriers to D/C:            Co-evaluation              AM-PAC OT "6 Clicks" Daily Activity     Outcome Measure Help from another person eating meals?: None Help from another person taking care of personal grooming?: A Little Help from another person toileting, which includes using toliet, bedpan, or urinal?: A Lot Help from another person bathing (including washing, rinsing, drying)?: A Lot Help from another person to put on and taking off regular upper body clothing?: A Little Help from another person to put on and taking off regular lower body clothing?: A Lot 6 Click Score: 16   End of Session Equipment Utilized During Treatment: Gait belt;Rolling walker;Oxygen Nurse Communication: Mobility status  Activity Tolerance: Patient tolerated treatment well Patient left: in chair;with call bell/phone within reach  OT Visit Diagnosis: Unsteadiness on feet (R26.81);Other abnormalities of gait and mobility (R26.89);Muscle weakness  (generalized) (M62.81)                Time: 6384-5364 OT Time Calculation (min): 30 min Charges:  OT General Charges $OT Visit: 1 Visit OT Evaluation $OT Eval Moderate Complexity: 1 Mod OT Treatments $Self Care/Home Management : 8-22 mins  Luisa Dago, OT/L   Acute OT Clinical Specialist Acute Rehabilitation Services Pager [phone] Office [phone]   [facility] 02/26/2019, 7:32 PM

## 2019-02-27 ENCOUNTER — Other Ambulatory Visit: Payer: Self-pay

## 2019-02-27 ENCOUNTER — Encounter (HOSPITAL_COMMUNITY): Payer: Self-pay

## 2019-02-27 LAB — BASIC METABOLIC PANEL
Anion gap: 9 (ref 5–15)
BUN: 29 mg/dL — ABNORMAL HIGH (ref 8–23)
CO2: 30 mmol/L (ref 22–32)
Calcium: 8.7 mg/dL — ABNORMAL LOW (ref 8.9–10.3)
Chloride: 105 mmol/L (ref 98–111)
Creatinine, Ser: 0.66 mg/dL (ref 0.44–1.00)
GFR calc Af Amer: >60 mL/min (ref >60)
GFR calc non Af Amer: >60 mL/min (ref >60)
Glucose, Bld: 117 mg/dL — ABNORMAL HIGH (ref 70–99)
Potassium: 3.4 mmol/L — ABNORMAL LOW (ref 3.5–5.1)
Sodium: 144 mmol/L (ref 135–145)

## 2019-02-27 LAB — CBC
HCT: 44.5 % (ref 36.0–46.0)
Hemoglobin: 13.5 g/dL (ref 12.0–15.0)
MCH: 30.1 pg (ref 26.0–34.0)
MCHC: 30.3 g/dL (ref 30.0–36.0)
MCV: 99.3 fL (ref 80.0–100.0)
Platelets: 278 10*3/uL (ref 150–400)
RBC: 4.48 MIL/uL (ref 3.87–5.11)
RDW: 13.6 % (ref 11.5–15.5)
WBC: 5.3 10*3/uL (ref 4.0–10.5)
nRBC: 0 % (ref 0.0–0.2)

## 2019-02-27 LAB — GLUCOSE, CAPILLARY
Glucose-Capillary: 115 mg/dL — ABNORMAL HIGH (ref 70–99)
Glucose-Capillary: 125 mg/dL — ABNORMAL HIGH (ref 70–99)

## 2019-02-27 MED ORDER — ADULT MULTIVITAMIN W/MINERALS CH
1.0000 | ORAL_TABLET | Freq: Every day | ORAL | Status: DC
  Administered 2019-02-27 – 2019-03-01 (×3): 1 via ORAL
  Filled 2019-02-27 (×3): qty 1

## 2019-02-27 MED ORDER — POTASSIUM CHLORIDE CRYS ER 20 MEQ PO TBCR
40.0000 meq | EXTENDED_RELEASE_TABLET | Freq: Once | ORAL | Status: AC
  Administered 2019-02-27: 12:00:00 40 meq via ORAL
  Filled 2019-02-27: qty 2

## 2019-02-27 MED ORDER — ENSURE ENLIVE PO LIQD
237.0000 mL | Freq: Three times a day (TID) | ORAL | Status: DC
  Administered 2019-02-28 – 2019-03-01 (×3): 237 mL via ORAL

## 2019-02-27 MED ORDER — ROPINIROLE HCL 1 MG PO TABS
1.0000 mg | ORAL_TABLET | Freq: Two times a day (BID) | ORAL | Status: DC | PRN
  Administered 2019-02-27 – 2019-02-28 (×2): 1 mg via ORAL
  Filled 2019-02-27 (×3): qty 1

## 2019-02-27 NOTE — Progress Notes (Signed)
Sherry NOTE  Lum KeasCatherine Kohlbeck ZOX:[id number] DOB: 06/13/1951 DOA: 02/15/2019 PCP: Becker, Sherry Becker   LOS: 12 days   Brief Narrative / Interim history: Becker is a 68 y.o. female with PMHx of depression, probable OSA-who presented from [facility] as a transfer on 4/8 with acute hypoxic respiratory failure secondary to COVID 19 viral pneumonia.   Approximate date Covid symptoms started: prior to 4/8  Significant events: 4/08 Admit, intubated. 4/8>>4/13 Plaquenil 4/9 Actemra 4/09 Prone  4/10 LCB 4/13 Prone 0230.Remains on ketamine, fentanyl, propofol gtt's>transition to dilaudid gtt 4/14 [facility] 4/14 Worsening Hypoxia/near resp arrest due to mucus plugging/proned 4/18 Extubated  Subjective: She is feeling better this morning, she was happy that she was able to work with physical therapy yesterday.  She states that her breathing is improving.  She denies any significant shortness of breath, Sherry chest pain, Sherry nausea or vomiting.  Nurse reports loose stools.  Assessment & Plan: Active Problems:   Acute hypoxemic respiratory failure (HCC)   Suspected Covid-19 Virus Infection   Positive D dimer   Endotracheally intubated   Encounter for central line placement   Pressure injury of skin   Acute respiratory distress syndrome (ARDS) due to COVID-19 virus   COVID-19 virus infection   Acute encephalopathy   Principal Problem  Acute Hypoxic Resp. Failure due to Acute Covid 19 Viral Illness during the ongoing 2020 Covid 19 Pandemic -Becker was intubated due to respiratory failure and was on mechanical ventilator up until 02/25/2019.  She continues to do well, physical therapy evaluated Becker and recommended CIR.  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  Blood cultures were sent and there are Sherry growth to date.  Urinalysis was unremarkable. -White count has remained stable -Continue to monitor off antibiotics  Hypokalemia -Potassium 3.4 this morning, continue to replete, recheck labs tomorrow  Hypotension -Resolved-possibly related to sedative agents.  Bradycardia -Resolved-thought to be related to sedative agents  Depression/anxiety -Cut back on sedative agents.   Morbid obesity -Body mass index is 48.52 kg/m.  Disposition -Discussed with daughter Arlyss Represslyssa over the phone, PT here recommends CIR and have placed an order for rehab evaluation.  Daughter is concerned about Becker going to any rehab facility given re-exposure to coronavirus in the future.  She is also concerned about Becker coming home and ideally would like her to test negative before coming home.  Unfortunately testing is still somewhat limited.  We will see how she progresses with PT.   Scheduled Meds: . chlorhexidine  15 mL Mouth Rinse BID  . Chlorhexidine Gluconate Cloth  6 each Topical Daily  . DULoxetine  20 mg Oral Daily  . enoxaparin (LOVENOX) injection  60 mg Subcutaneous Q24H  . guaiFENesin  600 mg Oral BID  . mouth rinse  15 mL Mouth Rinse q12n4p  . polyethylene glycol  17 g Oral Daily  . QUEtiapine  50 mg Oral QHS   Continuous Infusions: PRN Meds:.acetaminophen, bisacodyl, hydrALAZINE, ipratropium-albuterol, labetalol, lip balm  DVT prophylaxis: Lovenox Code Status: Partial code, Sherry CPR but do intubate Family Communication: Sherry family at bedside, discussed with daughter Randel Pigglyssa Seider over the phone, [phone] Disposition Plan: TBD  Consultants:   None   Procedures:  ETT 4/8>>4/18 CVC/left IJ 4/9>>4/19  Antimicrobials:  Finished course with Plaquenil, ceftriaxone and azithromycin  Objective: Vitals:   02/27/19 0449 02/27/19 0500 02/27/19 0746 02/27/19 1009  BP: 138/65  (!) 133/91 (!)  151/57  Pulse:  70 72   Resp:  (!) 21    Temp: 97.9 F (36.6 C)  98.3 F (36.8 C) 99.4 F (37.4 C)  TempSrc: Oral  Oral Oral  SpO2: 94% 92% 95% 95%  Weight: (!) 293.9 kg     Height:        Intake/Output Summary (Last 24 hours) at 02/27/2019 1115 Last data filed at 02/27/2019 0900 Gross Becker 24 hour  Intake 240 ml  Output -  Net 240 ml   Filed Weights   02/25/19 0500 02/26/19 0500 02/27/19 0449  Weight: 134.3 kg 128.2 kg (!) 293.9 kg    Examination:  Constitutional: NAD Eyes: PERRL, lids and conjunctivae normal ENMT: Mucous membranes are moist.  Neck: normal Respiratory: clear to auscultation bilaterally, Sherry wheezing, Sherry crackles. Cardiovascular: Regular rate and rhythm, Sherry murmurs / rubs / gallops. Sherry LE edema.  Abdomen: Sherry tenderness. Bowel sounds positive.  Musculoskeletal: Sherry clubbing / cyanosis.  Skin: Sherry rashes Neurologic: CN 2-12 grossly intact. Strength 5/5 in all 4  Data Reviewed: I have independently reviewed following labs and imaging studies   CBC: Recent Labs  Lab 02/23/19 0420  02/24/19 0435 02/25/19 0235 02/25/19 2040 02/26/19 0310 02/27/19 0543  WBC 5.2  --  4.6 5.4  --  5.0 5.3  HGB 13.1   < > 12.7 12.9 13.6 12.9 13.5  HCT 43.8   < > 41.6 42.9 40.0 42.0 44.5  MCV 99.1  --  98.8 98.8  --  99.3 99.3  PLT 267  --  254 289  --  266 278   < > = values in this interval not displayed.   Basic Metabolic Panel: Recent Labs  Lab 02/21/19 0530  02/22/19 0500  02/25/19 0235 02/25/19 2040 02/25/19 2155 02/26/19 0310 02/26/19 1845 02/27/19 0543  NA  --    < > 144   < > 145 148* 147* 149* 148* 144  K  --    < > 3.4*   < > 3.2* 3.3* 2.8* 3.1* 3.7 3.4*  CL  --   --  100   < > 100  --  103 105 105 105  CO2  --   --  33*   < > 33*  --  35* 31 33* 30  GLUCOSE  --   --  118*   < > 168*  --  123* 106* 139* 117*  BUN  --   --  19   < > 36*  --  35* 35* 32* 29*  CREATININE  --   --  0.77   < > 0.70  --  0.64 0.59 0.63 0.66  CALCIUM  --   --  8.7*   < >  8.7*  --  8.9 8.8* 9.1 8.7*  MG 2.0  --  2.3  --  2.5*  --   --  2.3  --   --    < > = values in this interval not displayed.   GFR: Estimated Creatinine Clearance: 159.8 mL/min (by C-G formula based on SCr of 0.66 mg/dL). Liver Function Tests: Recent Labs  Lab 02/23/19 0420  AST 27  ALT 32  ALKPHOS 108  BILITOT 0.6  PROT 6.6  ALBUMIN 3.5   Sherry results for input(s): LIPASE, AMYLASE in the last 168  hours. Sherry results for input(s): AMMONIA in the last 168 hours. Coagulation Profile: Sherry results for input(s): INR, PROTIME in the last 168 hours. Cardiac Enzymes: Sherry results for input(s): CKTOTAL, CKMB, CKMBINDEX, TROPONINI in the last 168 hours. BNP (last 3 results) Sherry results for input(s): PROBNP in the last 8760 hours. HbA1C: Sherry results for input(s): HGBA1C in the last 72 hours. CBG: Recent Labs  Lab 02/25/19 2353 02/26/19 0818 02/26/19 1133 02/26/19 1502 02/27/19 0753  GLUCAP 115* 118* 114* 154* 115*   Lipid Profile: Sherry results for input(s): CHOL, HDL, LDLCALC, TRIG, CHOLHDL, LDLDIRECT in the last 72 hours. Thyroid Function Tests: Sherry results for input(s): TSH, T4TOTAL, FREET4, T3FREE, THYROIDAB in the last 72 hours. Anemia Panel: Sherry results for input(s): VITAMINB12, FOLATE, FERRITIN, TIBC, IRON, RETICCTPCT in the last 72 hours. Urine analysis:    Component Value Date/Time   COLORURINE YELLOW 02/24/2019 1519   APPEARANCEUR CLEAR 02/24/2019 1519   LABSPEC 1.019 02/24/2019 1519   PHURINE 6.0 02/24/2019 1519   GLUCOSEU NEGATIVE 02/24/2019 1519   HGBUR SMALL (A) 02/24/2019 1519   BILIRUBINUR NEGATIVE 02/24/2019 1519   KETONESUR NEGATIVE 02/24/2019 1519   PROTEINUR NEGATIVE 02/24/2019 1519   NITRITE NEGATIVE 02/24/2019 1519   LEUKOCYTESUR TRACE (A) 02/24/2019 1519   Sepsis Labs: Invalid input(s): PROCALCITONIN, LACTICIDVEN  Recent Results (from the past 240 hour(s))  Culture, blood (routine x 2)     Status: None (Preliminary result)   Collection Time: 02/24/19   3:19 PM  Result Value Ref Range Status   Specimen Description   Final    BLOOD RIGHT HAND Performed at [facility], [address], [place], [place] [address]    Special Requests   Final    BOTTLES DRAWN AEROBIC AND ANAEROBIC Blood Culture adequate volume Performed at [facility], [address], [place], [place] [address]    Culture   Final    Sherry GROWTH 2 DAYS Performed at [facility], [address], [place], [place] [address]    Report Status PENDING  Incomplete  Culture, Urine     Status: None   Collection Time: 02/24/19  3:19 PM  Result Value Ref Range Status   Specimen Description   Final    URINE, RANDOM Performed at [facility], [address], [place], [place] [address]    Special Requests   Final    NONE Performed at [facility], [address], [place], [place] [address]    Culture   Final    Sherry GROWTH Performed at [facility], [address], [place], [place] [address]    Report Status 02/25/2019 FINAL  Final      Radiology Studies: Sherry results found.   Pamella Pert, MD, PhD [facility]  Contact via  [email or web address]  [facility] Office Info P: [phone]  F: [phone]

## 2019-02-27 NOTE — Progress Notes (Signed)
Nutrition Follow-up RD working remotely.  DOCUMENTATION CODES:   Morbid obesity  INTERVENTION:    Ensure Enlive po TID, each supplement provides 350 kcal and 20 grams of protein  Multivitamin daily  NUTRITION DIAGNOSIS:   Inadequate oral intake related to decreased appetite as evidenced by meal completion < 50%.  Ongoing  GOAL:   Patient will meet greater than or equal to 90% of their needs  Progressing  MONITOR:   PO intake, Supplement acceptance  ASSESSMENT:   68 yo female with PMH of HLD and depression who was transferred from [facility] on 4/8 with acute respiratory failure requiring intubation. COVID-19 testing came back positive. Patient transferred to [facility] 4/14.  Patient was extubated 4/18. Diet was advanced to regular by SLP.  Patient is consuming 50% of meals. PT working with patient. Hopeful for CIR soon. Labs and medications reviewed.  Patient reports appetite is okay. She had some fruit for breakfast and hopes to get a salad for supper. Discussed the importance of adequate protein and calorie intake to support continued recovery. Patient agreed to drink Ensure supplements to help increase protein and calorie intake.   NUTRITION - FOCUSED PHYSICAL EXAM:  unable to complete  Diet Order:   Diet Order            Diet regular Room service appropriate? Yes; Fluid consistency: Thin  Diet effective now              EDUCATION NEEDS:   No education needs have been identified at this time  Skin:  Skin Assessment: Skin Integrity Issues: Skin Integrity Issues:: Stage II Stage II: nose  Last BM:  4/19  Height:   Ht Readings from Last 1 Encounters:  02/17/19 5\' 4"  (1.626 m)    Weight:   Wt Readings from Last 1 Encounters:  02/27/19 (!) 293.9 kg  (suspect this weight is inaccurate)  02/26/19 128.2 kg (282.65 lbs)  Ideal Body Weight:  54.5 kg  BMI:  Body mass index is 111.22 kg/m.  Estimated Nutritional Needs:   Kcal:   1950-2150  Protein:  110-130 gm  Fluid:  2 L    Joaquin Courts, RD, LDN, CNSC Pager [phone] After Hours Pager [phone]

## 2019-02-27 NOTE — Progress Notes (Signed)
Inpatient Rehabilitation Admissions Coordinator  I have contacted Dr. Elvera Lennox for an inpt rehab consult order if pt would like to be considered for admission to CIR/Inpt rehab. I will follow.  Ottie Glazier, RN, MSN Rehab Admissions Coordinator [phone] 02/27/2019 10:01 AM

## 2019-02-27 NOTE — Progress Notes (Signed)
Physical Therapy Treatment Patient Details Name: Sherry Becker MRN: [id number] DOB: 12/26/1950 Today's Date: 02/27/2019    History of Present Illness 68 y.o. female who was admitted 4/8 [facility] with acute hypoxic respiratory failure requiring intubation in the setting of bilateral infiltrates. COVID positive; [facility]; ETT 4/8 >> 4/18.   PMHx: mood disorder, depression, obesity    PT Comments    Pt is progressing quickly with her mobility.  She was min guard assist to move around the room with RW, preform multiple standing exercises without rest holding to RW, and I turned down her O2 to 2 L O2  (from 3 when I walked in) and she was able to maintain sats at 94% with mobility.  I think if we can get her walking the hallway with a mask on tomorrow and get her set up with some home services (PT, OT, aide) that she could safely d/c home with her daughter's assist (her daughter is currently out of work).  PT will continue to follow acutely for safe mobility progression.    Follow Up Recommendations  Home health PT;Supervision - Intermittent(HHPT, OT, Aide if possible)     Equipment Recommendations  Other (comment)(home O2, has a RW and shower chair, declined a 3-in-1)    Recommendations for Other Services   NA     Precautions / Restrictions Precautions Precautions: Fall Precaution Comments: monitor O2    Mobility  Bed Mobility Overal bed mobility: Modified Independent             General bed mobility comments: Pt able to get EOB without difficulty, used bed rail, but not heavily.   Transfers Overall transfer level: Needs assistance Equipment used: Rolling walker (2 wheeled) Transfers: Sit to/from Stand Sit to Stand: Min guard         General transfer comment: Min guard assist for safety, after fatigue it required a few tries for her to get up from the low bed, but she was able to do it given extra time to try again.     Ambulation/Gait Ambulation/Gait assistance: Min guard Gait Distance (Feet): 8 Feet Assistive device: Rolling walker (2 wheeled) Gait Pattern/deviations: Step-through pattern     General Gait Details: limited by monitor line length.  Will come prepared tomorrow for hallway ambulation.           Balance Overall balance assessment: Needs assistance Sitting-balance support: Feet supported;No upper extremity supported Sitting balance-Leahy Scale: Good     Standing balance support: Single extremity supported;Bilateral upper extremity supported;No upper extremity supported Standing balance-Leahy Scale: Fair Standing balance comment: prefers to have hands supported in standing.                            Cognition Arousal/Alertness: Awake/alert Behavior During Therapy: WFL for tasks assessed/performed Overall Cognitive Status: Within Functional Limits for tasks assessed                                 General Comments: Pt seemed completely clear.  I did not test specifically, but reasoning and processing speed was normal.       Exercises Total Joint Exercises Knee Flexion: AROM;Both;10 reps;Standing General Exercises - Lower Extremity Hip Flexion/Marching: AROM;Both;10 reps;Standing Toe Raises: AROM;Both;10 reps;Standing Mini-Sqauts: AROM;Both;10 reps;Standing        Pertinent Vitals/Pain Pain Assessment: No/denies pain  PT Goals (current goals can now be found in the care plan section) Acute Rehab PT Goals Patient Stated Goal: to be independent and get back to work Progress towards PT goals: Progressing toward goals    Frequency    Min 4X/week      PT Plan Discharge plan needs to be updated       AM-PAC PT "6 Clicks" Mobility   Outcome Measure  Help needed turning from your back to your side while in a flat bed without using bedrails?: None Help needed moving from lying on your back to sitting on the side of a flat  bed without using bedrails?: None Help needed moving to and from a bed to a chair (including a wheelchair)?: A Little Help needed standing up from a chair using your arms (e.g., wheelchair or bedside chair)?: A Little Help needed to walk in hospital room?: A Little Help needed climbing 3-5 steps with a railing? : A Little 6 Click Score: 20    End of Session Equipment Utilized During Treatment: Oxygen(2 L O2 [place]) Activity Tolerance: Patient tolerated treatment well Patient left: in chair;with call bell/phone within reach Nurse Communication: Mobility status;Other (comment)(turned down O2 to 2L ) PT Visit Diagnosis: Muscle weakness (generalized) (M62.81);Difficulty in walking, not elsewhere classified (R26.2)     Time: 1530-1600 PT Time Calculation (min) (ACUTE ONLY): 30 min  Charges:  $Therapeutic Exercise: 8-22 mins $Therapeutic Activity: 8-22 mins            Antar Milks B. Bryley Kovacevic, PT, DPT  Acute Rehabilitation [phone] pager #[phone] office            02/27/2019, 5:22 PM

## 2019-02-27 NOTE — Progress Notes (Signed)
Inpatient Rehabilitation Admissions Coordinator  I spoke with patient by phone after her therapy session today. I also spoke with Drucilla Chalet, PT. Patient progressing well and it is felt that she can progress well and d/c directly home . Not in need of an inpt rehab admission at this time. . We will sign off. I have alerted RN CM, Darl Pikes. Please call me with any questions.  Ottie Glazier, RN, MSN Rehab Admissions Coordinator [phone] 02/27/2019 5:24 PM

## 2019-02-27 NOTE — Plan of Care (Signed)
  Problem: Education: Goal: Knowledge of General Education information will improve Description Including pain rating scale, medication(s)/side effects and non-pharmacologic comfort measures Outcome: Progressing   Problem: Health Behavior/Discharge Planning: Goal: Ability to manage health-related needs will improve Outcome: Progressing   Problem: Clinical Measurements: Goal: Ability to maintain clinical measurements within normal limits will improve Outcome: Progressing Goal: Will remain free from infection Outcome: Progressing Goal: Diagnostic test results will improve Outcome: Progressing Goal: Respiratory complications will improve Outcome: Progressing Goal: Cardiovascular complication will be avoided Outcome: Progressing   Problem: Activity: Goal: Risk for activity intolerance will decrease Outcome: Progressing   Problem: Nutrition: Goal: Adequate nutrition will be maintained Outcome: Progressing   Problem: Coping: Goal: Level of anxiety will decrease Outcome: Progressing   Problem: Elimination: Goal: Will not experience complications related to bowel motility Outcome: Progressing Goal: Will not experience complications related to urinary retention Outcome: Progressing   Problem: Pain Managment: Goal: General experience of comfort will improve Outcome: Progressing   Problem: Safety: Goal: Ability to remain free from injury will improve Outcome: Progressing   Problem: Skin Integrity: Goal: Risk for impaired skin integrity will decrease Outcome: Progressing   Problem: Activity: Goal: Ability to tolerate increased activity will improve Outcome: Progressing   Problem: Respiratory: Goal: Ability to maintain a clear airway and adequate ventilation will improve Outcome: Progressing   Problem: Role Relationship: Goal: Method of communication will improve Outcome: Progressing   Problem: Respiratory: Goal: Will maintain a patent airway Outcome:  Progressing Goal: Complications related to the disease process, condition or treatment will be avoided or minimized Outcome: Progressing

## 2019-02-28 ENCOUNTER — Inpatient Hospital Stay (HOSPITAL_COMMUNITY): Payer: Medicare Other

## 2019-02-28 LAB — COMPREHENSIVE METABOLIC PANEL
ALT: 96 U/L — ABNORMAL HIGH (ref 0–44)
AST: 52 U/L — ABNORMAL HIGH (ref 15–41)
Albumin: 3.6 g/dL (ref 3.5–5.0)
Alkaline Phosphatase: 83 U/L (ref 38–126)
Anion gap: 8 (ref 5–15)
BUN: 19 mg/dL (ref 8–23)
CO2: 28 mmol/L (ref 22–32)
Calcium: 8.8 mg/dL — ABNORMAL LOW (ref 8.9–10.3)
Chloride: 106 mmol/L (ref 98–111)
Creatinine, Ser: 0.62 mg/dL (ref 0.44–1.00)
GFR calc Af Amer: >60 mL/min (ref >60)
GFR calc non Af Amer: >60 mL/min (ref >60)
Glucose, Bld: 133 mg/dL — ABNORMAL HIGH (ref 70–99)
Potassium: 3.3 mmol/L — ABNORMAL LOW (ref 3.5–5.1)
Sodium: 142 mmol/L (ref 135–145)
Total Bilirubin: 1.1 mg/dL (ref 0.3–1.2)
Total Protein: 6.3 g/dL — ABNORMAL LOW (ref 6.5–8.1)

## 2019-02-28 LAB — CBC
HCT: 43.4 % (ref 36.0–46.0)
Hemoglobin: 13.2 g/dL (ref 12.0–15.0)
MCH: 29.7 pg (ref 26.0–34.0)
MCHC: 30.4 g/dL (ref 30.0–36.0)
MCV: 97.5 fL (ref 80.0–100.0)
Platelets: 277 10*3/uL (ref 150–400)
RBC: 4.45 MIL/uL (ref 3.87–5.11)
RDW: 13.2 % (ref 11.5–15.5)
WBC: 5.4 10*3/uL (ref 4.0–10.5)
nRBC: 0 % (ref 0.0–0.2)

## 2019-02-28 LAB — GLUCOSE, CAPILLARY
Glucose-Capillary: 141 mg/dL — ABNORMAL HIGH (ref 70–99)
Glucose-Capillary: 118 mg/dL — ABNORMAL HIGH (ref 70–99)
Glucose-Capillary: 147 mg/dL — ABNORMAL HIGH (ref 70–99)
Glucose-Capillary: 116 mg/dL — ABNORMAL HIGH (ref 70–99)

## 2019-02-28 LAB — PHOSPHORUS: Phosphorus: 4.8 mg/dL — ABNORMAL HIGH (ref 2.5–4.6)

## 2019-02-28 LAB — MAGNESIUM: Magnesium: 2.2 mg/dL (ref 1.7–2.4)

## 2019-02-28 MED ORDER — POTASSIUM CHLORIDE CRYS ER 20 MEQ PO TBCR
60.0000 meq | EXTENDED_RELEASE_TABLET | Freq: Once | ORAL | Status: AC
  Administered 2019-02-28: 09:00:00 60 meq via ORAL
  Filled 2019-02-28: qty 3

## 2019-02-28 NOTE — Progress Notes (Signed)
Physical Therapy Treatment Patient Details Name: Sherry KeasCatherine Barabas MRN: [id number] DOB: 04/13/1951 Today's Date: 02/28/2019    History of Present Illness 68 y.o. female who was admitted 4/8 [facility] with acute hypoxic respiratory failure requiring intubation in the setting of bilateral infiltrates. COVID positive; [facility]; ETT 4/8 >> 4/18.   PMHx: mood disorder, depression, obesity    PT Comments    The patient's O2 sats dropped to 87% on RA for ambulation x 50'. See separate note\ Patient reports that she has been ambulating to BR off of O2. REcommend HHPT/ Reviewed HEP  And progressive mobility.  Follow Up Recommendations  Home health PT;Supervision - Intermittent     Equipment Recommendations  Other (comment)    Recommendations for Other Services       Precautions / Restrictions Precautions Precautions: Fall Precaution Comments: monitor O2    Mobility  Bed Mobility               General bed mobility comments: OOB  Transfers Overall transfer level: Needs assistance Equipment used: None   Sit to Stand: Min guard         General transfer comment: Min guard assist for safety,   Ambulation/Gait Ambulation/Gait assistance: Min guard Gait Distance (Feet): 50 Feet Assistive device: (at times touched objects/wall) Gait Pattern/deviations: Step-through pattern Gait velocity: decreased   General Gait Details: patient  ambulated in room x 50' x 3 for saturation monitoring. then ambulated x 20' x 2  On  2 L O2 to BR    Stairs             Wheelchair Mobility    Modified Rankin (Stroke Patients Only)       Balance     Sitting balance-Leahy Scale: Good     Standing balance support: No upper extremity supported Standing balance-Leahy Scale: Fair Standing balance comment: prefers to have hands supported in standing.                            Cognition Arousal/Alertness:  Awake/alert                                            Exercises General Exercises - Lower Extremity Long Arc Quad: AROM;Seated;Both;10 reps Hip ABduction/ADduction: AROM;Seated;Both;10 reps Hip Flexion/Marching: AROM;Seated;Both;10 reps    General Comments        Pertinent Vitals/Pain Pain Assessment: No/denies pain    Home Living                      Prior Function            PT Goals (current goals can now be found in the care plan section) Progress towards PT goals: Progressing toward goals    Frequency    Min 4X/week      PT Plan Current plan remains appropriate    Co-evaluation              AM-PAC PT "6 Clicks" Mobility   Outcome Measure  Help needed turning from your back to your side while in a flat bed without using bedrails?: None Help needed moving from lying on your back to sitting on the side of a flat bed without using bedrails?: None Help needed moving to and from a bed to a  chair (including a wheelchair)?: A Little Help needed standing up from a chair using your arms (e.g., wheelchair or bedside chair)?: A Little Help needed to walk in hospital room?: A Little Help needed climbing 3-5 steps with a railing? : A Little 6 Click Score: 20    End of Session Equipment Utilized During Treatment: Oxygen(2 \\L ) Activity Tolerance: Patient tolerated treatment well Patient left: in chair Nurse Communication: Mobility status       Time: 1950-9326 PT Time Calculation (min) (ACUTE ONLY): 37 min  Charges:  $Gait Training: 8-22 mins $Self Care/Home Management: 8-22                     Blanchard Kelch PT Acute Rehabilitation Services Pager [phone] Office [phone]    Rada Hay 02/28/2019, 3:30 PM

## 2019-02-28 NOTE — Progress Notes (Signed)
Occupational Therapy Treatment Patient Details Name: Sherry Becker MRN: [id number] DOB: [birth year] Today's Date: 02/28/2019    History of present illness 68 y.o. female who was admitted 4/8 after transfer from [facility]  to [facility] with acute hypoxic respiratory failure requiring intubation in the setting of bilateral infiltrates. COVID positive; 4/14 Transfer to [facility]; ETT 4/8 >> 4/18.   PMHx: mood disorder, depression, obesity   OT comments  Making excellent progress. Fatigues easily and desats to 88 on RA during ADL. Pt unable to complete LB ADL and hygiene after toileting at this time. Will issue AE to pt in am. Pt anticipates DC tomorrow.   Follow Up Recommendations  Home health OT;Supervision - Intermittent    Equipment Recommendations  Other (comment)(AE)    Recommendations for Other Services      Precautions / Restrictions Precautions Precautions: Fall Precaution Comments: monitor O2       Mobility Bed Mobility               General bed mobility comments: OOB  Transfers Overall transfer level: Needs assistance Equipment used: None   Sit to Stand: Supervision         General transfer comment: Min guard assist for safety,     Balance     Sitting balance-Leahy Scale: Good     Standing balance support: No upper extremity supported Standing balance-Leahy Scale: Fair Standing balance comment: prefers to have hands supported in standing.                           ADL either performed or assessed with clinical judgement   ADL Overall ADL's : Needs assistance/impaired                                     Functional mobility during ADLs: Supervision/safety General ADL Comments: A required for hygiene after toileting adn LB ADL. will benefit from [place] education on E conservation     Vision       Perception     Praxis      Cognition Arousal/Alertness: Awake/alert Behavior During Therapy: WFL for tasks  assessed/performed Overall Cognitive Status: Within Functional Limits for tasks assessed                                          Exercises General Exercises - Lower Extremity Long Arc Quad: AROM;Seated;Both;10 reps Hip ABduction/ADduction: AROM;Seated;Both;10 reps Hip Flexion/Marching: AROM;Seated;Both;10 reps   Shoulder Instructions       General Comments Pt has been completing HEP with theraband; Pulling 2000 using incentive spirometer    Pertinent Vitals/ Pain       Pain Assessment: No/denies pain  Home Living                                          Prior Functioning/Environment              Frequency  Min 3X/week        Progress Toward Goals  OT Goals(current goals can now be found in the care plan section)  Progress towards OT goals: Progressing toward goals  Acute Rehab OT Goals Patient Stated Goal: to be independent and get  back to work OT Goal Formulation: With patient Time For Goal Achievement: 03/12/19 Potential to Achieve Goals: Good ADL Goals Pt Will Perform Lower Body Bathing: with modified independence;sit to/from stand;with adaptive equipment Pt Will Perform Lower Body Dressing: with modified independence;sit to/from stand;with adaptive equipment Pt Will Transfer to Toilet: with modified independence;ambulating;bedside commode Pt Will Perform Toileting - Clothing Manipulation and hygiene: with modified independence;sit to/from stand;with adaptive equipment Pt/caregiver will Perform Home Exercise Program: Increased strength;Both right and left upper extremity;With written HEP provided;With theraband  Plan Discharge plan needs to be updated    Co-evaluation                 AM-PAC OT "6 Clicks" Daily Activity     Outcome Measure   Help from another person eating meals?: None Help from another person taking care of personal grooming?: None Help from another person toileting, which includes using  toliet, bedpan, or urinal?: A Little Help from another person bathing (including washing, rinsing, drying)?: A Little Help from another person to put on and taking off regular upper body clothing?: None Help from another person to put on and taking off regular lower body clothing?: A Little 6 Click Score: 21    End of Session Equipment Utilized During Treatment: Oxygen(3L)  OT Visit Diagnosis: Unsteadiness on feet (R26.81);Other abnormalities of gait and mobility (R26.89);Muscle weakness (generalized) (M62.81)   Activity Tolerance Patient tolerated treatment well   Patient Left in chair;with call bell/phone within reach   Nurse Communication Mobility status        Time: [id number] OT Time Calculation (min): 20 min  Charges: OT General Charges $OT Visit: 1 Visit OT Treatments $Self Care/Home Management : 8-22 mins  Luisa DagoHilary Nysia Dell, OT/L   Acute OT Clinical Specialist Acute Rehabilitation Services Pager [phone] Office [phone]    [facility],HILLARY 02/28/2019, 5:18 PM

## 2019-02-28 NOTE — Progress Notes (Signed)
SATURATION QUALIFICATIONS: (This note is used to comply with regulatory documentation for home oxygen)  Patient Saturations on Room Air at Rest = 92%  Patient Saturations on Room Air while Ambulating = 87%  Patient Saturations on 2 Liters of oxygen while Ambulating = 90%  Please briefly explain why patient needs home oxygen:to maintain saturation during activity Blanchard Kelch PT Acute Rehabilitation Services Pager [phone] Office [phone]

## 2019-02-28 NOTE — Progress Notes (Signed)
PROGRESS NOTE  Sherry Becker ZOX:[id number] DOB: [birth year] DOA: 02/15/2019 PCP: Patient, No Pcp Per   LOS: 13 days   Brief Narrative / Interim history: Patient is a 68 y.o. female with PMHx of depression, probable OSA-who presented from [facility] as a transfer on 4/8 with acute hypoxic respiratory failure secondary to COVID 19 viral pneumonia.   Approximate date Covid symptoms started: prior to 4/8  Significant events: 4/08 Admit, intubated. 4/8>>4/13 Plaquenil 4/9 Actemra 4/09 Prone  4/10 LCB 4/13 Prone 0230.Remains on ketamine, fentanyl, propofol gtt's>transition to dilaudid gtt 4/14 Transfer to [facility] 4/14 Worsening Hypoxia/near resp arrest due to mucus plugging/proned 4/18 Extubated  Subjective: Wants to go home  Assessment & Plan: Active Problems:   Acute hypoxemic respiratory failure (HCC)   Suspected Covid-19 Virus Infection   Positive D dimer   Endotracheally intubated   Encounter for central line placement   Pressure injury of skin   Acute respiratory distress syndrome (ARDS) due to COVID-19 virus   COVID-19 virus infection   Acute encephalopathy   Principal Problem  Acute Hypoxic Resp. Failure due to Acute Covid 19 Viral Illness during the ongoing 2020 Covid 19 Pandemic -Patient was intubated due to respiratory failure and was on mechanical ventilator up until 02/25/2019.  She continues to do well, physical therapy evaluated patient and recommended CIR initially, however yesterday did a lot better and now recommending home with home health -Attempt to wean off oxygen as able, however there is a possibility that she will go home with oxygen.  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  Blood cultures were sent and  there are no growth to date.  Urinalysis was unremarkable. -She has remained afebrile now for 72 hours, white count is stable and cultures are negative  Hypokalemia -K 3.3 this morning, continue to replete  Hypotension -Resolved-possibly related to sedative agents.  Bradycardia -Resolved-thought to be related to sedative agents  Depression/anxiety -Cut back on sedative agents.   Morbid obesity -Body mass index is 48.52 kg/m.  Disposition -Discussed with daughter Arlyss Repress over the phone, PT initially recommending CIR however on re-evaluation OK for home, plan for tomorrow home d/c pending stable respiratory status. She is to work again with PT this afternoon    Scheduled Meds: . chlorhexidine  15 mL Mouth Rinse BID  . Chlorhexidine Gluconate Cloth  6 each Topical Daily  . DULoxetine  20 mg Oral Daily  . enoxaparin (LOVENOX) injection  60 mg Subcutaneous Q24H  . feeding supplement (ENSURE ENLIVE)  237 mL Oral TID BM  . guaiFENesin  600 mg Oral BID  . mouth rinse  15 mL Mouth Rinse q12n4p  . multivitamin with minerals  1 tablet Oral Daily  . polyethylene glycol  17 g Oral Daily  . QUEtiapine  50 mg Oral QHS   Continuous Infusions: PRN Meds:.acetaminophen, bisacodyl, hydrALAZINE, ipratropium-albuterol, labetalol, lip balm, rOPINIRole  DVT prophylaxis: Lovenox Code Status: Partial code, no CPR but do intubate Family Communication: no family at bedside, discussed with daughter Randel Pigg over the phone, [phone] Disposition Plan: home in am   Consultants:   None   Procedures:  ETT 4/8>>4/18 CVC/left IJ 4/9>>4/19  Antimicrobials:  Finished course with Plaquenil, ceftriaxone and azithromycin  Objective: Vitals:   02/28/19 0050 02/28/19  0410 02/28/19 0500 02/28/19 0539  BP:  (!) 118/99    Pulse: 77 81  69  Resp: 16 13  13   Temp:  98.1 F (36.7 C)    TempSrc:  Oral    SpO2: 94% 91%  100%  Weight:   133.4 kg   Height:        Intake/Output Summary (Last  24 hours) at 02/28/2019 0929 Last data filed at 02/28/2019 0200 Gross per 24 hour  Intake 960 ml  Output -  Net 960 ml   Filed Weights   02/26/19 0500 02/27/19 0449 02/28/19 0500  Weight: 128.2 kg (!) 293.9 kg 133.4 kg    Examination:  Constitutional: NAD Eyes: no scleral icterus  ENMT: mmm Respiratory: CTA biL, diminished at the bases, no wheezing  Cardiovascular: RRR, no murmurs Abdomen: soft, NT, ND, BS+ Musculoskeletal: no clubbing / cyanosis.  Skin: no rashes seen Neurologic: non focal, ambulatory   Data Reviewed: I have independently reviewed following labs and imaging studies   CBC: Recent Labs  Lab 02/24/19 0435 02/25/19 0235 02/25/19 2040 02/26/19 0310 02/27/19 0543 02/28/19 0450  WBC 4.6 5.4  --  5.0 5.3 5.4  HGB 12.7 12.9 13.6 12.9 13.5 13.2  HCT 41.6 42.9 40.0 42.0 44.5 43.4  MCV 98.8 98.8  --  99.3 99.3 97.5  PLT 254 289  --  266 278 277   Basic Metabolic Panel: Recent Labs  Lab 02/22/19 0500  02/25/19 0235  02/25/19 2155 02/26/19 0310 02/26/19 1845 02/27/19 0543 02/28/19 0450  NA 144   < > 145   < > 147* 149* 148* 144 142  K 3.4*   < > 3.2*   < > 2.8* 3.1* 3.7 3.4* 3.3*  CL 100   < > 100  --  103 105 105 105 106  CO2 33*   < > 33*  --  35* 31 33* 30 28  GLUCOSE 118*   < > 168*  --  123* 106* 139* 117* 133*  BUN 19   < > 36*  --  35* 35* 32* 29* 19  CREATININE 0.77   < > 0.70  --  0.64 0.59 0.63 0.66 0.62  CALCIUM 8.7*   < > 8.7*  --  8.9 8.8* 9.1 8.7* 8.8*  MG 2.3  --  2.5*  --   --  2.3  --   --  2.2  PHOS  --   --   --   --   --   --   --   --  4.8*   < > = values in this interval not displayed.   GFR: Estimated Creatinine Clearance: 91.6 mL/min (by C-G formula based on SCr of 0.62 mg/dL). Liver Function Tests: Recent Labs  Lab 02/23/19 0420 02/28/19 0450  AST 27 52*  ALT 32 96*  ALKPHOS 108 83  BILITOT 0.6 1.1  PROT 6.6 6.3*  ALBUMIN 3.5 3.6   No results for input(s): LIPASE, AMYLASE in the last 168 hours. No results for  input(s): AMMONIA in the last 168 hours. Coagulation Profile: No results for input(s): INR, PROTIME in the last 168 hours. Cardiac Enzymes: No results for input(s): CKTOTAL, CKMB, CKMBINDEX, TROPONINI in the last 168 hours. BNP (last 3 results) No results for input(s): PROBNP in the last 8760 hours. HbA1C: No results for input(s): HGBA1C in the last 72 hours. CBG: Recent Labs  Lab 02/26/19 1133 02/26/19 1502 02/27/19 0753 02/27/19 2342 02/28/19 0715  GLUCAP 114* 154* 115*  125* 118*   Lipid Profile: No results for input(s): CHOL, HDL, LDLCALC, TRIG, CHOLHDL, LDLDIRECT in the last 72 hours. Thyroid Function Tests: No results for input(s): TSH, T4TOTAL, FREET4, T3FREE, THYROIDAB in the last 72 hours. Anemia Panel: No results for input(s): VITAMINB12, FOLATE, FERRITIN, TIBC, IRON, RETICCTPCT in the last 72 hours. Urine analysis:    Component Value Date/Time   COLORURINE YELLOW 02/24/2019 1519   APPEARANCEUR CLEAR 02/24/2019 1519   LABSPEC 1.019 02/24/2019 1519   PHURINE 6.0 02/24/2019 1519   GLUCOSEU NEGATIVE 02/24/2019 1519   HGBUR SMALL (A) 02/24/2019 1519   BILIRUBINUR NEGATIVE 02/24/2019 1519   KETONESUR NEGATIVE 02/24/2019 1519   PROTEINUR NEGATIVE 02/24/2019 1519   NITRITE NEGATIVE 02/24/2019 1519   LEUKOCYTESUR TRACE (A) 02/24/2019 1519   Sepsis Labs: Invalid input(s): PROCALCITONIN, LACTICIDVEN  Recent Results (from the past 240 hour(s))  Culture, blood (routine x 2)     Status: None (Preliminary result)   Collection Time: 02/24/19  3:19 PM  Result Value Ref Range Status   Specimen Description   Final    BLOOD RIGHT HAND Performed at [facility], [address] [place], [place] [address]    Special Requests   Final    BOTTLES DRAWN AEROBIC AND ANAEROBIC Blood Culture adequate volume Performed at [facility], [address] [place], [place] [address]    Culture   Final    NO GROWTH 3 DAYS Performed at [facility],  [address] [place], [place] [address]    Report Status PENDING  Incomplete  Culture, Urine     Status: None   Collection Time: 02/24/19  3:19 PM  Result Value Ref Range Status   Specimen Description   Final    URINE, RANDOM Performed at [facility], [address] [place], [place] [address]    Special Requests   Final    NONE Performed at [facility], [address] [place], [place] [address]    Culture   Final    NO GROWTH Performed at [facility], [address] [place], [place] [address]    Report Status 02/25/2019 FINAL  Final      Radiology Studies: Dg Chest Port 1 View  Result Date: 02/28/2019 CLINICAL DATA:  Dyspnea EXAM: PORTABLE CHEST 1 VIEW COMPARISON:  02/25/2019 FINDINGS: Cardiac shadow is within normal limits. Mild aortic calcifications are noted. The lungs are well aerated bilaterally. Minimal scarring is noted in the left base. No focal infiltrate or sizable effusion is noted. No acute bony abnormality is seen. Multilevel osteophytic changes are seen. IMPRESSION: No acute abnormality noted. Electronically Signed   By: Alcide CleverMark  Lukens M.D.   On: 02/28/2019 09:11     Pamella Pertostin Luis Nickles, MD, PhD [facility]  Contact via  [email or web address]  [facility] Office Info P: [phone]  F: [phone]

## 2019-02-28 NOTE — TOC Initial Note (Addendum)
Transition of Care [facility]) - Initial/Assessment Note    Patient Details  Name: Sherry Becker MRN: [id number] Date of Birth: Jul 27, 1951  [facility] CM/SW Contact:    Durenda Guthrie, RN Phone Number: 02/28/2019, 2:39 PM  Clinical Narrative: 68 y.o. female who was admitted 4/8 after transfer from [facility]  to [facility] with acute hypoxic respiratory failure requiring intubation in the setting of bilateral infiltrates. COVID positive .  Case manager spoke with patient's daughter,Alyssa [phone], concerning discharge plan and DME. Choice for Home Health agency was discussed, Alyssa doesn't live in [place], ok with CM calling referral to [facility]. Alyssa said she will be staying with her mom at home until she recovers. CM called Dara Hoyer, [facility] with referral. 2:09pm CM called Durward Fortes with [facility] to begin arranging oxygen for patient use at discharge. Case manager called [facility] to have order form and demographics faxed to [employer]. Goal is to have oxygen delivered to patient's home prior to her being discharged. CM will continue to monitor.   Expected Discharge Plan: Home w Home Health Services     Patient Goals and CMS Choice   CMS Medicare.gov Compare Post Acute Care list provided to:: Patient Represenative (must comment) Choice offered to / list presented to : Adult Children  Expected Discharge Plan and Services Expected Discharge Plan: Home w Home Health Services   Discharge Planning Services: CM Consult Post Acute Care Choice: Home Health Living arrangements for the past 2 months: Single Family Home                 DME Arranged: N/A, Oxygen DME Agency: [employer] HH Arranged: PT, OT [facility]  Prior Living Arrangements/Services Living arrangements for the past 2 months: Single Family Home Lives with:: Self Patient language and need for interpreter  reviewed:: No Do you feel safe going back to the place where you live?: Yes      Need for Family Participation in Patient Care: Yes (Comment) Care giver support system in place?: Yes (comment)   Criminal Activity/Legal Involvement Pertinent to Current Situation/Hospitalization: No - Comment as needed  Activities of Daily Living Home Assistive Devices/Equipment: Cane (specify quad or straight), Walker (specify type), Shower chair without back ADL Screening (condition at time of admission) Patient's cognitive ability adequate to safely complete daily activities?: Yes Is the patient deaf or have difficulty hearing?: No Does the patient have difficulty seeing, even when wearing glasses/contacts?: No Does the patient have difficulty concentrating, remembering, or making decisions?: No Patient able to express need for assistance with ADLs?: Yes Does the patient have difficulty dressing or bathing?: Yes Independently performs ADLs?: No Communication: Independent Dressing (OT): Needs assistance Is this a change from baseline?: Change from baseline, expected to last >3 days Grooming: Needs assistance Is this a change from baseline?: Change from baseline, expected to last >3 days Feeding: Independent Bathing: Needs assistance Is this a change from baseline?: Change from baseline, expected to last >3 days Toileting: Needs assistance Is this a change from baseline?: Change from baseline, expected to last >3days In/Out Bed: Needs assistance Is this a change from baseline?: Change from baseline, expected to last >3 days Walks in Home: Needs assistance Is this a change from baseline?: Change from baseline, expected to last >3 days Does the patient have difficulty walking or climbing stairs?: Yes Weakness of Legs: Both Weakness of Arms/Hands: Both  Permission Sought/Granted  Permission sought to share information with : Case Manager                Emotional Assessment       Orientation:  : Oriented to Self, Oriented to Situation, Oriented to Place, Oriented to  Time Alcohol / Substance Use: Not Applicable    Admission diagnosis:  BILATERAL PNEUMONIA Patient Active Problem List   Diagnosis Date Noted  . COVID-19 virus infection   . Acute encephalopathy   . Pressure injury of skin 02/21/2019  . Acute respiratory distress syndrome (ARDS) due to COVID-19 virus   . Acute hypoxemic respiratory failure (HCC) 02/15/2019  . Suspected Covid-19 Virus Infection   . Positive D dimer   . Endotracheally intubated   . Encounter for central line placement    PCP:  Patient, No Pcp Per Pharmacy:   [id number] - [place], [place] - [address] AT [facility] [address] [place] [address] Phone: [phone] Fax: [phone]     Social Determinants of Health (SDOH) Interventions    Readmission Risk Interventions No flowsheet data found.

## 2019-03-01 LAB — CULTURE, BLOOD (ROUTINE X 2)
Culture: NO GROWTH
Special Requests: ADEQUATE

## 2019-03-01 LAB — GLUCOSE, CAPILLARY: Glucose-Capillary: 121 mg/dL — ABNORMAL HIGH (ref 70–99)

## 2019-03-01 NOTE — Care Management (Signed)
1:14pm  Case manager confirmed that patient arrived home safely, oxygen in place. Patient's daughter said she will arrange patient's follow up appointment with her mom's primary MD. Case manager explained that it will likely be a telephonic appointment due to COVID 19 diagnosis.

## 2019-03-01 NOTE — Progress Notes (Signed)
Occupational Therapy Treatment Patient Details Name: Sherry Becker MRN: [id number] DOB: 02/21/1951 Today's Date: 03/01/2019    History of present illness 68 y.o. female who was admitted 4/8 after transfer from [facility]  to [facility] with acute hypoxic respiratory failure requiring intubation in the setting of bilateral infiltrates. COVID positive; 4/14 Transfer to [facility]; ETT 4/8 >> 4/18.   PMHx: mood disorder, depression, obesity   OT comments  Pt continues to progress. Pt issued AE to assist with LB ADL and hygiene after toileting. All further OT to be addressed by HHOT.   Follow Up Recommendations  Home health OT;Supervision - Intermittent    Equipment Recommendations  None recommended by OT    Recommendations for Other Services      Precautions / Restrictions Precautions Precautions: Fall Restrictions Weight Bearing Restrictions: No       Mobility Bed Mobility                  Transfers Overall transfer level: Needs assistance               General transfer comment: S. Recommend pt use her RW at home. Pt agreed.     Balance                                           ADL either performed or assessed with clinical judgement   ADL                                       Functional mobility during ADLs: Supervision/safety General ADL Comments: Educated pt on use of AE to assist wtih LB aDL and hygiene after toileting. Pt able to return demonstrate use of AE. Educataed pton energy conservation strategies and fall prevention. Pt verbalized understanding.      Vision       Perception     Praxis      Cognition Arousal/Alertness: Awake/alert Behavior During Therapy: WFL for tasks assessed/performed Overall Cognitive Status: Within Functional Limits for tasks assessed                                 General Comments: Pt appears anxious about wanting to leave. Unsure of impaired insight - most  likely related to stress of situation        Exercises     Shoulder Instructions       General Comments      Pertinent Vitals/ Pain       Pain Assessment: No/denies pain  Home Living                                          Prior Functioning/Environment              Frequency  Min 3X/week        Progress Toward Goals  OT Goals(current goals can now be found in the care plan section)  Progress towards OT goals: Progressing toward goals  Acute Rehab OT Goals Patient Stated Goal: to be independent and get back to work OT Goal Formulation: With patient Time For Goal Achievement: 03/12/19 Potential to Achieve Goals: Good ADL Goals  Pt Will Perform Lower Body Bathing: with modified independence;sit to/from stand;with adaptive equipment Pt Will Perform Lower Body Dressing: with modified independence;sit to/from stand;with adaptive equipment Pt Will Transfer to Toilet: with modified independence;ambulating;bedside commode Pt Will Perform Toileting - Clothing Manipulation and hygiene: with modified independence;sit to/from stand;with adaptive equipment Pt/caregiver will Perform Home Exercise Program: Increased strength;Both right and left upper extremity;With written HEP provided;With theraband  Plan Discharge plan remains appropriate    Co-evaluation                 AM-PAC OT "6 Clicks" Daily Activity     Outcome Measure   Help from another person eating meals?: None Help from another person taking care of personal grooming?: None Help from another person toileting, which includes using toliet, bedpan, or urinal?: A Little Help from another person bathing (including washing, rinsing, drying)?: A Little Help from another person to put on and taking off regular upper body clothing?: None Help from another person to put on and taking off regular lower body clothing?: A Little 6 Click Score: 21    End of Session    OT Visit Diagnosis:  Unsteadiness on feet (R26.81);Other abnormalities of gait and mobility (R26.89);Muscle weakness (generalized) (M62.81)   Activity Tolerance Patient tolerated treatment well   Patient Left in chair;with call bell/phone within reach   Nurse Communication Mobility status        Time: 1025-1050 OT Time Calculation (min): 25 min  Charges: OT Treatments $Self Care/Home Management : 23-37 mins  Luisa DagoHilary Kyan Giannone, OT/L   Acute OT Clinical Specialist Acute Rehabilitation Services Pager [phone] Office [phone] Luisa DagoHilary Ronnesha Mester, OT/L   Acute OT Clinical Specialist Acute Rehabilitation Services Pager [phone] Office [phone]    [facility],HILLARY 03/01/2019, 11:05 AM

## 2019-03-01 NOTE — Progress Notes (Signed)
Physical Therapy Treatment Patient Details Name: Sherry KeasCatherine Leist MRN: [id number] DOB: 09/04/1951 Today's Date: 03/01/2019    History of Present Illness 68 y.o. female who was admitted 4/8 [facility] with acute hypoxic respiratory failure requiring intubation in the setting of bilateral infiltrates. COVID positive; 4/[facility]; ETT 4/8 >> 4/18.   PMHx: mood disorder, depression, obesity    PT Comments    Pt is progressing well with her mobility, supervision for gait with RW.  She still desats on RA with mobility (see separate note) despite no DOE.  She reports compliance with HEP and incentive spirometer(IS) use.  She is hopeful to d/c home soon.  PT to follow acutely until d/c confirmed.    Follow Up Recommendations  Home health PT;Supervision - Intermittent     Equipment Recommendations  Other (comment)(home O2, already has a RW)    Recommendations for Other Services   NA     Precautions / Restrictions Precautions Precautions: Fall Precaution Comments: monitor O2 Restrictions Weight Bearing Restrictions: No    Mobility  Bed Mobility               General bed mobility comments: Pt was OOB in chair.   Transfers Overall transfer level: Needs assistance Equipment used: Rolling walker (2 wheeled) Transfers: Sit to/from Stand Sit to Stand: Supervision         General transfer comment: supervision for safety  Ambulation/Gait Ambulation/Gait assistance: Supervision Gait Distance (Feet): 50 Feet Assistive device: Rolling walker (2 wheeled) Gait Pattern/deviations: Step-through pattern Gait velocity: decreased Gait velocity interpretation: 1.31 - 2.62 ft/sec, indicative of limited community ambulator General Gait Details: Pt safe ambulating with RW with supervision.  She attempted to walk without O2, however, despite no DOE, she still dropped to 85% on RA during gait.  Came back up to 91% with application of 2 L O2 [place].          Balance Overall balance assessment: Needs assistance Sitting-balance support: Feet supported;No upper extremity supported Sitting balance-Leahy Scale: Good     Standing balance support: No upper extremity supported;Bilateral upper extremity supported;Single extremity supported Standing balance-Leahy Scale: Fair Standing balance comment: close supervision for safety                            Cognition Arousal/Alertness: Awake/alert Behavior During Therapy: WFL for tasks assessed/performed Overall Cognitive Status: Within Functional Limits for tasks assessed                                 General Comments: Not specifically tested, seemed normal today         General Comments General comments (skin integrity, edema, etc.): Pt reported compliance with HEP and IS use.  She does still desat despite no DOE.  She will need home O2 (being ordered).        Pertinent Vitals/Pain Pain Assessment: No/denies pain           PT Goals (current goals can now be found in the care plan section) Acute Rehab PT Goals Patient Stated Goal: to go home today! Progress towards PT goals: Progressing toward goals    Frequency    Min 5X/week      PT Plan Current plan remains appropriate       AM-PAC PT "6 Clicks" Mobility   Outcome Measure  Help needed turning from your  back to your side while in a flat bed without using bedrails?: None Help needed moving from lying on your back to sitting on the side of a flat bed without using bedrails?: None Help needed moving to and from a bed to a chair (including a wheelchair)?: None Help needed standing up from a chair using your arms (e.g., wheelchair or bedside chair)?: None Help needed to walk in hospital room?: None Help needed climbing 3-5 steps with a railing? : A Little 6 Click Score: 23    End of Session Equipment Utilized During Treatment: Oxygen(2 L)       PT Visit Diagnosis: Muscle weakness  (generalized) (M62.81);Difficulty in walking, not elsewhere classified (R26.2)     Time: 1003-1016 PT Time Calculation (min) (ACUTE ONLY): 13 min  Charges:  $Gait Training: 8-22 mins                    Nely Dedmon B. Kimora Stankovic, PT, DPT  Acute Rehabilitation [phone] pager #[phone] office   03/01/2019, 11:35 AM

## 2019-03-01 NOTE — Discharge Summary (Signed)
Physician Discharge Summary  Sherry Becker ZOX:[id number] DOB: [birth year] DOA: 02/15/2019  PCP: Patient, No Pcp Per  Admit date: 02/15/2019 Discharge date: 03/01/2019  Admitted From: home Disposition:  home  Recommendations for Outpatient Follow-up:  1. Follow up with PCP in 1-2 weeks 2. Follow-up with pulmonology in 2 to 4 weeks 3. Patient discharged on home oxygen  Home Health: PT, OT Equipment/Devices: Walker  Discharge Condition: Stable CODE STATUS: Full code Diet recommendation: Regular  HPI: Per admitting, Pt is encephelopathic; therefore, this HPI is obtained from chart review. Sherry Becker is a 68 y.o. female who has a PMH as outlined below (see "past medical history").  She presented to [facility] 4/8 with SOB.  Stated that symptoms had been present for 1 month and had minimal improvement.  She had seen PCP and "had a blood test done" (no results known or available).  No known travel history.  No known additional symptoms including fever, cough. In ED at [facility], she had SpO2 of 34% on room air.  She was placed on 15L NRB and was transported to [facility] for further evaluation and management and as a COVID-19 rule out. Upon arrival to [facility], she was intubated for persistent hypoxic respiratory failure requiring 15L NRB with increased WOB. Labs from [facility] noteable for D-dimer of 2158, CRP 185, lactic acid 3.9, PCT 0.18, WBC 7.4  EKG with NSR and QTc 404.  CXR demonstrated extensive bilateral pulmonary infiltrates.  Hospital Course:  Principal Problem Acute Hypoxic Resp. Failure due to Acute Covid 19 Viral Illness during the ongoing 2020 Covid 19 Pandemic -Patient was intubated due to respiratory failure and was on mechanical ventilator up until 02/25/2019.  She was successfully extubated, mobility improved, worked with physical therapy and recommended home health which will be arranged prior to discharge.  She remained hypoxic with ambulation requiring 2 to 3 L nasal cannula  upon discharge.  She was advised to follow-up with PCP as well as pulmonology as an outpatient for further weaning off of the oxygen versus repeat evaluation to see if there is any long-term effects from her severe viral infection.  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  Speech evaluated patient and she is able to tolerate a regular diet  Active Problems Fever-She developed fever on 4/17 and 4/18, has been afebrile since.  Blood cultures were sent and there are no growth to date.  Urinalysis was unremarkable. She has remained afebrile now for 96 hours, white count is stable and blood cultures are negative Hypotension -Resolved-possibly related to sedative agents. Bradycardia -Resolved-thought to be related to sedative agents Depression/anxiety -resume home medications Morbid obesity -Body mass index is 48.52 kg/m. Will need significant weight loss upon discharge  Significant events: 4/08 Admit, intubated. 4/8>>4/13 Plaquenil 4/9 Actemra 4/09 Prone  4/10 LCB 4/13 Prone 0230.Remains on ketamine, fentanyl, propofol gtt's>transition to dilaudid gtt 4/14 Transfer to [facility] 4/14 Worsening Hypoxia/near resp arrest due to mucus plugging/proned 4/18 Extubated 4/22 d/c home   Discharge Diagnoses:  Active Problems:   Acute hypoxemic respiratory failure (HCC)   Suspected Covid-19 Virus Infection   Positive D dimer   Endotracheally intubated   Encounter for central line placement   Pressure injury of skin   Acute respiratory distress syndrome (ARDS) due to COVID-19 virus   COVID-19 virus infection   Acute encephalopathy   Discharge Instructions  Allergies as of 03/01/2019   No Known Allergies  Medication List    TAKE these medications   DULoxetine 20 MG capsule Commonly known as:  CYMBALTA Take 20 mg by mouth daily.   loperamide 2 MG capsule Commonly known as:  IMODIUM Take 2 mg  by mouth as needed for diarrhea or loose stools.   pseudoephedrine 30 MG tablet Commonly known as:  SUDAFED Take 30 mg by mouth every 4 (four) hours as needed for congestion.   QUEtiapine 200 MG tablet Commonly known as:  SEROQUEL Take 200 mg by mouth daily.   rOPINIRole 2 MG tablet Commonly known as:  REQUIP Take 2 mg by mouth 2 (two) times daily as needed.   simvastatin 20 MG tablet Commonly known as:  ZOCOR Take 20 mg by mouth daily.            Durable Medical Equipment  (From admission, onward)         Start     Ordered   02/28/19 1207  For home use only DME oxygen  Once    Question Answer Comment  Mode or (Route) Nasal cannula   Liters per Minute 2   Frequency Continuous (stationary and portable oxygen unit needed)   Oxygen delivery system Gas      02/28/19 1206         Follow-up Information    [facility]. Schedule an appointment as soon as possible for a visit in 4 week(s).   Specialty:  Pulmonology Contact information: [address] [place] [address] [phone]          Consultations:  PCCM   Dg Chest 1 View  Result Date: 02/20/2019 CLINICAL DATA:  Evaluate endotracheal tube EXAM: CHEST  1 VIEW COMPARISON:  02/20/2019, 02/19/2019 FINDINGS: Endotracheal tube with the tip 5.3 cm above the carina. Nasogastric tube coursing below the diaphragm. Left jugular central venous catheter with the tip projecting over the SVC. Bilateral peripheral interstitial and alveolar airspace opacities improved compared from x-ray performed earlier same day. No pleural effusion or pneumothorax. Stable cardiomegaly. No acute osseous abnormality. IMPRESSION: 1. Support lines and tubing in satisfactory position. 2. Bilateral interstitial and alveolar airspace opacities with improved aeration compared with the most recent prior examination performed earlier same day. Electronically Signed   By: Elige Ko   On: 02/20/2019 19:32    Dg Chest Port 1 View  Result Date: 02/28/2019 CLINICAL DATA:  Dyspnea EXAM: PORTABLE CHEST 1 VIEW COMPARISON:  02/25/2019 FINDINGS: Cardiac shadow is within normal limits. Mild aortic calcifications are noted. The lungs are well aerated bilaterally. Minimal scarring is noted in the left base. No focal infiltrate or sizable effusion is noted. No acute bony abnormality is seen. Multilevel osteophytic changes are seen. IMPRESSION: No acute abnormality noted. Electronically Signed   By: Alcide Clever M.D.   On: 02/28/2019 09:11   Dg Chest Port 1 View  Result Date: 02/25/2019 CLINICAL DATA:  +COVID SOB EXAM: PORTABLE CHEST 1 VIEW COMPARISON:  02/24/2011 FINDINGS: Endotracheal tube and NG tube are unchanged. Central venous line unchanged. Stable cardiac silhouette. Low lung volumes. No focal consolidation. No pneumothorax. IMPRESSION: 1. Stable support apparatus. 2. Low lung volumes.  No focal airspace disease. Electronically Signed   By: Genevive Bi M.D.   On: 02/25/2019 07:29   Dg Chest Port 1 View  Result Date: 02/24/2019 CLINICAL DATA:  COVID-19 pneumonia.  Shortness of breath. EXAM: PORTABLE CHEST 1 VIEW COMPARISON:  One-view chest x-ray 02/23/2019 FINDINGS: Heart size is exaggerated by low lung volumes.  Endotracheal tube terminates 4 cm from the carina. Left IJ line is stable. Aeration of both lung bases is slightly improved. IMPRESSION: 1. Slight improved aeration at both lung bases. 2. Support apparatus is stable. Electronically Signed   By: Marin Roberts M.D.   On: 02/24/2019 07:02   Dg Chest Port 1 View  Result Date: 02/23/2019 CLINICAL DATA:  Encounter for shortness of breath. ARDS due to COVID-19 virus. EXAM: PORTABLE CHEST 1 VIEW COMPARISON:  One-view chest x-ray 02/22/2019 FINDINGS: Heart is mildly enlarged. Endotracheal tube terminates 3.8 cm above the carina. Left IJ line is stable. NG tube courses off the inferior border the film. Progressive interstitial and airspace disease  is present at both lung bases. Bilateral pleural effusions are present. IMPRESSION: 1. Increasing interstitial and airspace disease and both lung bases compatible with worsening ARDS. 2. Cardiomegaly and mild pulmonary vascular congestion. 3. Support apparatus is stable. Electronically Signed   By: Marin Roberts M.D.   On: 02/23/2019 07:46   Dg Chest Port 1 View  Result Date: 02/22/2019 CLINICAL DATA:  Acute hypoxemic respiratory failure. Endotracheal tube present. EXAM: PORTABLE CHEST 1 VIEW COMPARISON:  02/21/2019 FINDINGS: Endotracheal tube and left jugular central venous catheter remain in appropriate position. Nasogastric tube is been removed since prior exam. Patient is rotated to the right. Heart size is stable. Diffuse interstitial infiltrates show no significant change since previous study. Mild airspace opacity in the right lung base is also unchanged. No new or worsening areas of pulmonary opacity identified. IMPRESSION: No significant change in diffuse interstitial infiltrates and mild right basilar airspace opacity. Electronically Signed   By: Myles Rosenthal M.D.   On: 02/22/2019 08:55   Dg Chest Port 1 View  Result Date: 02/21/2019 CLINICAL DATA:  Acute respiratory failure with hypoxemia. EXAM: PORTABLE CHEST 1 VIEW COMPARISON:  02/20/2019 FINDINGS: Patient is rotated to the right. Endotracheal tube has tip 7 cm above the carina. Enteric tube courses into the region of the stomach and off the film as tip is not visualized. Left IJ central venous catheter unchanged. Lungs are adequately inflated. Mild hazy prominence of the infrahilar vasculature likely mild vascular congestion/edema. No effusion. Stable cardiomegaly. Remainder of the exam is unchanged. IMPRESSION: Mild stable cardiomegaly with suggestion mild vascular congestion. Tubes and lines as described. Electronically Signed   By: Elberta Fortis M.D.   On: 02/21/2019 16:31   Dg Chest Port 1 View  Result Date: 02/20/2019 CLINICAL  DATA:  Respiratory failure EXAM: PORTABLE CHEST 1 VIEW COMPARISON:  02/19/2019 FINDINGS: Endotracheal tube, NG tube, left jugular central venous catheter are stable. Diffuse bilateral airspace opacities are stable. Normal heart size. No pneumothorax. IMPRESSION: Stable support apparatus and diffuse bilateral airspace disease. Electronically Signed   By: Jolaine Click M.D.   On: 02/20/2019 07:26   Dg Chest Port 1 View  Result Date: 02/19/2019 CLINICAL DATA:  Respiratory failure.  COVID-19 EXAM: PORTABLE CHEST 1 VIEW COMPARISON:  02/18/2019 FINDINGS: Prone positioning. Endotracheal tube in good position. Left jugular catheter tip in the SVC. NG in the stomach. Cardiac enlargement. Diffuse bilateral airspace disease unchanged. No pneumothorax or effusion. IMPRESSION: Diffuse bilateral airspace disease unchanged. Support lines in good position and unchanged. Electronically Signed   By: Marlan Palau M.D.   On: 02/19/2019 08:08   Dg Chest Port 1 View  Result Date: 02/18/2019 CLINICAL DATA:  Ventilator dependent respiratory failure. Suspected COVID-19 infection. EXAM: PORTABLE CHEST 1 VIEW COMPARISON:  02/17/2019 and earlier. FINDINGS: Endotracheal tube tip in satisfactory position approximately  6 cm above the carina. RIGHT-sided central venous catheter crosses the midline with its tip likely in the LEFT innominate vein. Gastric tube difficult to visualize distally due to radiographic technique but likely courses below the diaphragm. Worsening airspace consolidation throughout the RIGHT lung and in the LEFT UPPER LOBE. Stable airspace consolidation in the LEFT LOWER LOBE. Stable cardiomegaly. Pulmonary venous hypertension without overt edema, unchanged. IMPRESSION: 1. Support apparatus satisfactory. 2. Worsening pneumonia throughout the RIGHT lung and in the LEFT UPPER LOBE. Stable LEFT LOWER LOBE pneumonia. 3. Stable cardiomegaly. Pulmonary venous hypertension without overt edema. Electronically Signed   By:  Hulan Saas M.D.   On: 02/18/2019 08:03   Dg Chest Port 1 View  Result Date: 02/17/2019 CLINICAL DATA:  Intubation EXAM: PORTABLE CHEST 1 VIEW COMPARISON:  02/17/2019 FINDINGS: endotracheal tube is 5 cm above the carina. NG tube enters the stomach. Cardiomegaly, vascular congestion. Bilateral interstitial prominence and lower lobe airspace opacities, likely edema. Small effusions suspected. No acute bony abnormality. IMPRESSION: Endotracheal tube 5 cm above the carina. Cardiomegaly with vascular congestion, interstitial prominence and lower lobe opacities, likely edema. Small layering effusions. Electronically Signed   By: Charlett Nose M.D.   On: 02/17/2019 16:55   Dg Chest Port 1 View  Result Date: 02/17/2019 CLINICAL DATA:  Respiratory failure. Coronavirus positive patient. The image was taken prone. EXAM: PORTABLE CHEST 1 VIEW COMPARISON:  Single-view of the chest earlier today and 02/16/2019. FINDINGS: Support tubes and lines are unchanged. Bilateral airspace disease persists without change. Heart size is appears enlarged. No pneumothorax or pleural effusion. IMPRESSION: No change in support apparatus or left worse than right airspace disease since the examination earlier today. Electronically Signed   By: Drusilla Kanner M.D.   On: 02/17/2019 11:46   Dg Chest Port 1 View  Result Date: 02/17/2019 CLINICAL DATA:  Respiratory failure. Possible COVID-19. patient in prone position. EXAM: PORTABLE CHEST 1 VIEW COMPARISON:  02/16/2019 FINDINGS: Endotracheal tube tip is 5 cm above the carina. Orogastric or nasogastric tube enters the abdomen. Left internal jugular central line tip in the SVC at the azygos level. Bilateral patchy pulmonary infiltrates persist, similar allowing for technical differences. IMPRESSION: Persistent bilateral patchy pulmonary infiltrates. Lines and tubes satisfactory. Today's study is done prone. Electronically Signed   By: Paulina Fusi M.D.   On: 02/17/2019 06:20   Dg  Chest Port 1 View  Result Date: 02/16/2019 CLINICAL DATA:  Central line placement. EXAM: PORTABLE CHEST 1 VIEW COMPARISON:  Radiograph earlier this day at 0530 hour FINDINGS: Tip of the left internal jugular central venous catheter projects over the brachiocephalic/SVC confluence. No pneumothorax. Endotracheal tube tip 6.3 cm from the carina and the thoracic inlet. Enteric tube in place, not well visualized distal to the lower esophagus. Again seen cardiomegaly. No significant change in mixed interstitial and airspace opacities, side for equivocal worsening at the left lung base. IMPRESSION: 1. Tip of the left internal jugular central venous catheter projects over the brachiocephalic/SVC confluence. No pneumothorax. 2. Unchanged cardiomegaly. Bilateral mixed interstitial and airspace opacities grossly stable, with equivocal worsening at the left lung base. Electronically Signed   By: Narda Rutherford M.D.   On: 02/16/2019 19:12   Dg Chest Port 1 View  Result Date: 02/16/2019 CLINICAL DATA:  68 year old female with respiratory failure EXAM: PORTABLE CHEST 1 VIEW COMPARISON:  02/15/2019 FINDINGS: Cardiomediastinal silhouette unchanged in size and contour. Cardiomegaly. Endotracheal tube terminates 5.6 cm above the carina. Gastric tube terminates out of the field of view. Improving  aeration of the bilateral lungs, with persisting mixed interstitial and airspace opacities and thickening of the minor fissure. No pneumothorax. No large pleural effusion. IMPRESSION: Persisting mixed interstitial and airspace opacities, with slight improved aeration from the prior plain film. Unchanged endotracheal tube and gastric tube Electronically Signed   By: Gilmer Mor D.O.   On: 02/16/2019 08:09   Dg Chest Port 1 View  Result Date: 02/15/2019 CLINICAL DATA:  Intubation. EXAM: PORTABLE CHEST 1 VIEW COMPARISON:  Radiograph earlier this day at [facility] FINDINGS: Endotracheal tube tip 5.1 cm from the carina. Enteric  tube in place, tip likely below the diaphragm but not well visualized distal to the lower thorax. Progression in diffuse bilateral interstitial and patchy airspace disease from exam earlier this day. Slight cardiomegaly compared to prior. Hazy opacity at the left lung base may be developing effusion or soft tissue attenuation. No pneumothorax. IMPRESSION: 1. Endotracheal tube tip 5.1 cm from the carina. Enteric tube in place, tip likely below the diaphragm but not well visualized in the lower thorax. 2. Progression in diffuse bilateral interstitial and patchy airspace disease from exam earlier this day, favoring multifocal pneumonia over pulmonary edema. Slight enlargement of the cardiac silhouette and possible developing left pleural effusion are new. Electronically Signed   By: Narda Rutherford M.D.   On: 02/15/2019 21:51      Subjective: - no chest pain, shortness of breath, no abdominal pain, nausea or vomiting.  Wants to go home  Discharge Exam: BP (!) 136/59 (BP Location: Right Leg)    Pulse 76    Temp 98.2 F (36.8 C) (Oral)    Resp (!) 22    Ht  (1.626 m)    Wt 130.4 kg    SpO2 95%    BMI 49.35 kg/m   General: Pt is alert, awake, not in acute distress Cardiovascular: RRR, S1/S2 +, no rubs, no gallops Respiratory: CTA bilaterally, no wheezing, no rhonchi Abdominal: Soft, NT, ND, bowel sounds + Extremities: no edema, no cyanosis    The results of significant diagnostics from this hospitalization (including imaging, microbiology, ancillary and laboratory) are listed below for reference.     Microbiology: Recent Results (from the past 240 hour(s))  Culture, blood (routine x 2)     Status: None (Preliminary result)   Collection Time: 02/24/19  3:19 PM  Result Value Ref Range Status   Specimen Description   Final    BLOOD RIGHT HAND Performed at [facility], [address], [place], [place] [address]    Special Requests   Final    BOTTLES DRAWN AEROBIC AND ANAEROBIC  Blood Culture adequate volume Performed at [facility], [address] [place], [place] [address]    Culture   Final    NO GROWTH 4 DAYS Performed at [facility], [address], [place], [place] [address]    Report Status PENDING  Incomplete  Culture, Urine     Status: None   Collection Time: 02/24/19  3:19 PM  Result Value Ref Range Status   Specimen Description   Final    URINE, RANDOM Performed at [facility], [address] [place], [place] [address]    Special Requests   Final    NONE Performed at [facility], [address], [place], [place] [address]    Culture   Final    NO GROWTH Performed at [facility], [address] [place], [place]  [address]    Report Status 02/25/2019 FINAL  Final     Labs: BNP (last 3 results) Recent Labs    02/24/19 0435  BNP 36.5   Basic Metabolic Panel: Recent Labs  Lab 02/25/19 0235  02/25/19 2155 02/26/19 0310 02/26/19 1845 02/27/19 0543 02/28/19 0450  NA 145   < > 147* 149* 148* 144 142  K 3.2*   < > 2.8* 3.1* 3.7 3.4* 3.3*  CL 100  --  103 105 105 105 106  CO2 33*  --  35* 31 33* 30 28  GLUCOSE 168*  --  123* 106* 139* 117* 133*  BUN 36*  --  35* 35* 32* 29* 19  CREATININE 0.70  --  0.64 0.59 0.63 0.66 0.62  CALCIUM 8.7*  --  8.9 8.8* 9.1 8.7* 8.8*  MG 2.5*  --   --  2.3  --   --  2.2  PHOS  --   --   --   --   --   --  4.8*   < > = values in this interval not displayed.   Liver Function Tests: Recent Labs  Lab 02/23/19 0420 02/28/19 0450  AST 27 52*  ALT 32 96*  ALKPHOS 108 83  BILITOT 0.6 1.1  PROT 6.6 6.3*  ALBUMIN 3.5 3.6   No results for input(s): LIPASE, AMYLASE in the last 168 hours. No results for input(s): AMMONIA in the last 168 hours. CBC: Recent Labs  Lab 02/24/19 0435 02/25/19 0235 02/25/19 2040 02/26/19 0310 02/27/19 0543 02/28/19 0450  WBC 4.6 5.4  --  5.0 5.3 5.4  HGB 12.7 12.9 13.6 12.9 13.5 13.2  HCT 41.6  42.9 40.0 42.0 44.5 43.4  MCV 98.8 98.8  --  99.3 99.3 97.5  PLT 254 289  --  266 278 277   Cardiac Enzymes: No results for input(s): CKTOTAL, CKMB, CKMBINDEX, TROPONINI in the last 168 hours. BNP: Invalid input(s): POCBNP CBG: Recent Labs  Lab 02/27/19 2342 02/28/19 0715 02/28/19 1519 02/28/19 2327 03/01/19 0722  GLUCAP 125* 118* 147* 116* 121*   D-Dimer No results for input(s): DDIMER in the last 72 hours. Hgb A1c No results for input(s): HGBA1C in the last 72 hours. Lipid Profile No results for input(s): CHOL, HDL, LDLCALC, TRIG, CHOLHDL, LDLDIRECT in the last 72 hours. Thyroid function studies No results for input(s): TSH, T4TOTAL, T3FREE, THYROIDAB in the last 72 hours.  Invalid input(s): FREET3 Anemia work up No results for input(s): VITAMINB12, FOLATE, FERRITIN, TIBC, IRON, RETICCTPCT in the last 72 hours. Urinalysis    Component Value Date/Time   COLORURINE YELLOW 02/24/2019 1519   APPEARANCEUR CLEAR 02/24/2019 1519   LABSPEC 1.019 02/24/2019 1519   PHURINE 6.0 02/24/2019 1519   GLUCOSEU NEGATIVE 02/24/2019 1519   HGBUR SMALL (A) 02/24/2019 1519   BILIRUBINUR NEGATIVE 02/24/2019 1519   KETONESUR NEGATIVE 02/24/2019 1519   PROTEINUR NEGATIVE 02/24/2019 1519   NITRITE NEGATIVE 02/24/2019 1519   LEUKOCYTESUR TRACE (A) 02/24/2019 1519   Sepsis Labs Invalid input(s): PROCALCITONIN,  WBC,  LACTICIDVEN  FURTHER DISCHARGE INSTRUCTIONS:   Get Medicines reviewed and adjusted: Please take all your medications with you for your next visit with your Primary MD   Laboratory/radiological data: Please request your Primary MD to go over all hospital tests and procedure/radiological results at the follow up, please ask your Primary MD to get all Hospital records sent to his/her office.   In some cases, they will be blood work, cultures and biopsy results pending  at the time of your discharge. Please request that your primary care M.D. goes through all the records of your  hospital data and follows up on these results.   Also Note the following: If you experience worsening of your admission symptoms, develop shortness of breath, life threatening emergency, suicidal or homicidal thoughts you must seek medical attention immediately by calling 911 or calling your MD immediately  if symptoms less severe.   You must read complete instructions/literature along with all the possible adverse reactions/side effects for all the Medicines you take and that have been prescribed to you. Take any new Medicines after you have completely understood and accpet all the possible adverse reactions/side effects.    Do not drive when taking Pain medications or sleeping medications (Benzodaizepines)   Do not take more than prescribed Pain, Sleep and Anxiety Medications. It is not advisable to combine anxiety,sleep and pain medications without talking with your primary care practitioner   Special Instructions: If you have smoked or chewed Tobacco  in the last 2 yrs please stop smoking, stop any regular Alcohol  and or any Recreational drug use.   Wear Seat belts while driving.   Please note: You were cared for by a hospitalist during your hospital stay. Once you are discharged, your primary care physician will handle any further medical issues. Please note that NO REFILLS for any discharge medications will be authorized once you are discharged, as it is imperative that you return to your primary care physician (or establish a relationship with a primary care physician if you do not have one) for your post hospital discharge needs so that they can reassess your need for medications and monitor your lab values.  Time coordinating discharge: 45 minutes  SIGNED:  Pamella Pertostin Yobany Vroom, MD, PhD 03/01/2019, 10:50 AM

## 2019-03-01 NOTE — Progress Notes (Signed)
03/01/2019  SATURATION QUALIFICATIONS: (This note is used to comply with regulatory documentation for home oxygen)  Patient Saturations on Room Air at Rest = 94%  Patient Saturations on Room Air while Ambulating = 85%  Patient Saturations on 2 Liters of oxygen while Ambulating = 94%  Please briefly explain why patient needs home oxygen: Pt desats with mobility on RA.    Rollene Rotunda Lamarion Mcevers, PT, DPT  Acute Rehabilitation [phone] pager [phone]) [phone] office

## 2019-03-01 NOTE — Plan of Care (Signed)
  Problem: Health Behavior/Discharge Planning: Goal: Ability to manage health-related needs will improve Outcome: Progressing   Problem: Clinical Measurements: Goal: Ability to maintain clinical measurements within normal limits will improve Outcome: Progressing Goal: Will remain free from infection Outcome: Progressing Goal: Diagnostic test results will improve Outcome: Progressing Goal: Respiratory complications will improve Outcome: Progressing Goal: Cardiovascular complication will be avoided Outcome: Progressing   Problem: Activity: Goal: Risk for activity intolerance will decrease Outcome: Progressing   Problem: Nutrition: Goal: Adequate nutrition will be maintained Outcome: Progressing   Problem: Coping: Goal: Level of anxiety will decrease Outcome: Progressing   Problem: Elimination: Goal: Will not experience complications related to bowel motility Outcome: Progressing Goal: Will not experience complications related to urinary retention Outcome: Progressing   Problem: Pain Managment: Goal: General experience of comfort will improve Outcome: Progressing   Problem: Safety: Goal: Ability to remain free from injury will improve Outcome: Progressing   Problem: Skin Integrity: Goal: Risk for impaired skin integrity will decrease Outcome: Progressing   Problem: Activity: Goal: Ability to tolerate increased activity will improve Outcome: Progressing   Problem: Respiratory: Goal: Ability to maintain a clear airway and adequate ventilation will improve Outcome: Progressing   Problem: Role Relationship: Goal: Method of communication will improve Outcome: Progressing   Problem: Respiratory: Goal: Will maintain a patent airway Outcome: Progressing Goal: Complications related to the disease process, condition or treatment will be avoided or minimized Outcome: Progressing

## 2019-03-01 NOTE — Discharge Instructions (Signed)
Follow with PCP within 1 week Follow up with Pulmonary in 2-4 weeks  Please get a complete blood count and chemistry panel checked by your Primary MD at your next visit, and again as instructed by your Primary MD. Please get your medications reviewed and adjusted by your Primary MD.  Please request your Primary MD to go over all Hospital Tests and Procedure/Radiological results at the follow up, please get all Hospital records sent to your Prim MD by signing hospital release before you go home.  In some cases, there will be blood work, cultures and biopsy results pending at the time of your discharge. Please request that your primary care M.D. goes through all the records of your hospital data and follows up on these results.  If you had Pneumonia of Lung problems at the Hospital: Please get a 2 view Chest X ray done in 6-8 weeks after hospital discharge or sooner if instructed by your Primary MD.  If you have Congestive Heart Failure: Please call your Cardiologist or Primary MD anytime you have any of the following symptoms:  1) 3 pound weight gain in 24 hours or 5 pounds in 1 week  2) shortness of breath, with or without a dry hacking cough  3) swelling in the hands, feet or stomach  4) if you have to sleep on extra pillows at night in order to breathe  Follow cardiac low salt diet and 1.5 lit/day fluid restriction.  If you have diabetes Accuchecks 4 times/day, Once in AM empty stomach and then before each meal. Log in all results and show them to your primary doctor at your next visit. If any glucose reading is under 80 or above 300 call your primary MD immediately.  If you have Seizure/Convulsions/Epilepsy: Please do not drive, operate heavy machinery, participate in activities at heights or participate in high speed sports until you have seen by Primary MD or a Neurologist and advised to do so again.  If you had Gastrointestinal Bleeding: Please ask your Primary MD to check a  complete blood count within one week of discharge or at your next visit. Your endoscopic/colonoscopic biopsies that are pending at the time of discharge, will also need to followed by your Primary MD.  Get Medicines reviewed and adjusted. Please take all your medications with you for your next visit with your Primary MD  Please request your Primary MD to go over all hospital tests and procedure/radiological results at the follow up, please ask your Primary MD to get all Hospital records sent to his/her office.  If you experience worsening of your admission symptoms, develop shortness of breath, life threatening emergency, suicidal or homicidal thoughts you must seek medical attention immediately by calling 911 or calling your MD immediately  if symptoms less severe.  You must read complete instructions/literature along with all the possible adverse reactions/side effects for all the Medicines you take and that have been prescribed to you. Take any new Medicines after you have completely understood and accpet all the possible adverse reactions/side effects.   Do not drive or operate heavy machinery when taking Pain medications.   Do not take more than prescribed Pain, Sleep and Anxiety Medications  Special Instructions: If you have smoked or chewed Tobacco  in the last 2 yrs please stop smoking, stop any regular Alcohol  and or any Recreational drug use.  Wear Seat belts while driving.  Please note You were cared for by a hospitalist during your hospital stay. If you have  any questions about your discharge medications or the care you received while you were in the hospital after you are discharged, you can call the unit and asked to speak with the hospitalist on call if the hospitalist that took care of you is not available. Once you are discharged, your primary care physician will handle any further medical issues. Please note that NO REFILLS for any discharge medications will be authorized once  you are discharged, as it is imperative that you return to your primary care physician (or establish a relationship with a primary care physician if you do not have one) for your aftercare needs so that they can reassess your need for medications and monitor your lab values.  You can reach the hospitalist office at phone [phone] or fax [phone]   If you do not have a primary care physician, you can call [phone] for a physician referral.  Activity: As tolerated with Full fall precautions use walker/cane & assistance as needed    Diet: regular  Disposition Home

## 2019-04-05 ENCOUNTER — Telehealth: Payer: Self-pay

## 2019-04-05 NOTE — Telephone Encounter (Signed)
If she's feeling great then 4 weeks from d/c is about right - we can see her sooner for new symptoms unless she has recurrent fever in which case would need repeat covid 19 testing prior to ov

## 2019-04-05 NOTE — Telephone Encounter (Signed)
Spoke with patient. She stated that she was in the hospital in April for COVID19. She was discharged on April 22nd and was discharged from quarantine by the health department on May 6th. She is not currently having any symptoms and hasn't had any symptoms since late April. She just wants to get a follow up from her hospital visit to make sure that she is still on the road to recovery.    Dr. Sherene Sires, with your knowledge, how soon would you advise that we schedule a hospital follow up for her? Please advise. Thanks!

## 2019-04-05 NOTE — Telephone Encounter (Signed)
ATC pt, line rang busy x2. Will try back. 

## 2019-04-06 NOTE — Telephone Encounter (Signed)
Called and spoke with Patient.  Patient is currently having no Covid symptoms, no fever, and has been released from health department quarantine. Patient saw BQ while in the hospital and was told to have hospital follow up with pulmonary.   Per message from Dr Sherene Sires, patient needs hospital follow up 4 weeks from discharge.   Patient scheduled with Waynetta Sandy, NP, 04/10/19, at 2pm. Nothing further at this time.

## 2019-04-10 ENCOUNTER — Inpatient Hospital Stay: Payer: Managed Care, Other (non HMO) | Admitting: Primary Care

## 2019-04-11 ENCOUNTER — Ambulatory Visit (INDEPENDENT_AMBULATORY_CARE_PROVIDER_SITE_OTHER): Payer: Managed Care, Other (non HMO) | Admitting: Primary Care

## 2019-04-11 ENCOUNTER — Other Ambulatory Visit: Payer: Self-pay

## 2019-04-11 ENCOUNTER — Ambulatory Visit: Payer: Managed Care, Other (non HMO)

## 2019-04-11 ENCOUNTER — Encounter: Payer: Self-pay | Admitting: Primary Care

## 2019-04-11 VITALS — BP 130/86 | HR 89 | Temp 98.2°F | Ht 65.0 in | Wt 281.0 lb

## 2019-04-11 DIAGNOSIS — R918 Other nonspecific abnormal finding of lung field: Secondary | ICD-10-CM | POA: Insufficient documentation

## 2019-04-11 DIAGNOSIS — R0683 Snoring: Secondary | ICD-10-CM | POA: Diagnosis not present

## 2019-04-11 DIAGNOSIS — U071 COVID-19: Secondary | ICD-10-CM

## 2019-04-11 DIAGNOSIS — J80 Acute respiratory distress syndrome: Secondary | ICD-10-CM | POA: Diagnosis not present

## 2019-04-11 NOTE — Assessment & Plan Note (Addendum)
-   Feels well, clinically improved - Continues to have some mild decrease in stamina and hypoxia on exertion - Lungs clear, diminished. O2 sat 93% RA at rest.  - Advised patient continue 2L home oxygen on exertion and at night  - Needs repeat COVID testing - Needs repeat CXR at next office visit  - Needs PFT d/t hx tobacco use - pending negative covid testing  - Due for Pneumonia vaccine at next visit- pending negative covid testing

## 2019-04-11 NOTE — Progress Notes (Signed)
Great news Glad she is doing well Reviewed, agree

## 2019-04-11 NOTE — Assessment & Plan Note (Signed)
-   Needs HST pending negative covid test

## 2019-04-11 NOTE — Patient Instructions (Addendum)
  Needs to establish with new pulmonary provider in 2-4 weeks (new patient visit) If unable to establish with MD needs Telephone visit or OV if negative covid with NP in 2 weeks   Recommendations: Continue using 2L oxygen on exertion and at night Pneumonia vaccine at next visit- pending negative covid testing  Orders: Labs today  CXR at next office visit  Home sleep test - pending negative covid test  Pulmonary function testing- pending negative covid test

## 2019-04-11 NOTE — Progress Notes (Signed)
@Patient  ID: Sherry Becker, female    DOB: [birth year], 68 y.o.   MRN: [id number]  Chief Complaint  Patient presents with  . Follow-up    low O2 in AM and exertion    Referring provider: No ref. provider found  HPI: 68 year old female, former smoker quit 2018 (10 pack year hx). PMH significant for HLD, probable OSA. Patient hospitalized in April 2020 for hypoxic respiratory failure d/t acute Covid-19.   Hospital course 4/8-4/22: Presented to [facility] hospital on 4/8 with shortness of breath. Symptoms present for 4-5 weeks prior.  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 No known travel history. No cough or fever. In [place] ED SpO2 34% on room air. Transferred to [facility] and was intubated for hypoxic respiratory failure. CXR showed extensive bilateral pulmonary infiltrates. SARS-CoV-2 detected. Treated with Plaquenil from 4/8-4/13 and received Actemra x2 doses. Extubated on 4/18. Repeat CXR on 4/18 showed lungs well aerated bilaterally, min scarring left base. No focal infiltrate. Discharged with home oxygen.   04/11/2019 Patient presents today for hospital follow-up. She is recovering and feeling well. She is currently on disability from work. She has some residual decreased stamina which she is working with physical therapy on. Reports oxygen desaturation to 88-90% on exertion but recovers quickly. Has home oxygen. She has been watching her weight and is down 13 lbs by trying to eat better. States that she has been eating 3-4 small meals a day. States that she has been sleeping pretty well. Wakes up 4 times a night to use the bathroom. Reports loud snoring, she has woken up in the past short of breath. She has never had a sleep test. Epworth score is 2. No fever, cough or wheezing   No Known Allergies   There is no immunization history on file for this patient.  Past Medical History:   Diagnosis Date  . Depression   . Restless leg syndrome     Tobacco History: Social History   Tobacco Use  Smoking Status Former Smoker  . Packs/day: 1.00  . Years: 10.00  . Pack years: 10.00  . Types: Cigarettes  . Last attempt to quit: 04/10/2017  . Years since quitting: 2.0  Smokeless Tobacco Never Used   Counseling given: Not Answered   Outpatient Medications Prior to Visit  Medication Sig Dispense Refill  . DULoxetine (CYMBALTA) 20 MG capsule Take 20 mg by mouth daily.    . QUEtiapine (SEROQUEL) 200 MG tablet Take 200 mg by mouth daily.    Marland Kitchen rOPINIRole (REQUIP) 2 MG tablet Take 2 mg by mouth 2 (two) times daily as needed.     . simvastatin (ZOCOR) 20 MG tablet Take 20 mg by mouth daily.    Marland Kitchen loperamide (IMODIUM) 2 MG capsule Take 2 mg by mouth as needed for diarrhea or loose stools.    . pseudoephedrine (SUDAFED) 30 MG tablet Take 30 mg by mouth every 4 (four) hours as needed for congestion.     No facility-administered medications prior to visit.     Review of Systems  Review of Systems  Constitutional: Negative.   Respiratory: Negative.   Cardiovascular: Negative.     Physical Exam  BP 130/86 (BP Location: Left Arm, Cuff Size: Large)   Pulse 89   Temp 98.2 F (36.8 C)   Ht 5\' 5"  (1.651 m)   Wt 281 lb (127.5 kg)   SpO2 93%  BMI 46.76 kg/m  Physical Exam Constitutional:      General: She is not in acute distress.    Appearance: Normal appearance. She is obese. She is not ill-appearing.  HENT:     Right Ear: Tympanic membrane and ear canal normal.     Left Ear: Tympanic membrane and ear canal normal.     Mouth/Throat:     Mouth: Mucous membranes are moist.     Pharynx: Oropharynx is clear.     Comments: Mallampati class II Cardiovascular:     Rate and Rhythm: Normal rate and regular rhythm.  Pulmonary:     Effort: Pulmonary effort is normal. No respiratory distress.     Breath sounds: No wheezing, rhonchi or rales.     Comments: CTA, diminished   Musculoskeletal: Normal range of motion.  Skin:    General: Skin is warm and dry.  Neurological:     General: No focal deficit present.     Mental Status: She is alert and oriented to person, place, and time. Mental status is at baseline.  Psychiatric:        Mood and Affect: Mood normal.        Behavior: Behavior normal.        Thought Content: Thought content normal.        Judgment: Judgment normal.      Lab Results:  CBC    Component Value Date/Time   WBC 5.4 02/28/2019 0450   RBC 4.45 02/28/2019 0450   HGB 13.2 02/28/2019 0450   HCT 43.4 02/28/2019 0450   PLT 277 02/28/2019 0450   MCV 97.5 02/28/2019 0450   MCH 29.7 02/28/2019 0450   MCHC 30.4 02/28/2019 0450   RDW 13.2 02/28/2019 0450   LYMPHSABS 1.1 02/20/2019 0345   MONOABS 0.3 02/20/2019 0345   EOSABS 0.1 02/20/2019 0345   BASOSABS 0.0 02/20/2019 0345    BMET    Component Value Date/Time   NA 142 02/28/2019 0450   K 3.3 (L) 02/28/2019 0450   CL 106 02/28/2019 0450   CO2 28 02/28/2019 0450   GLUCOSE 133 (H) 02/28/2019 0450   BUN 19 02/28/2019 0450   CREATININE 0.62 02/28/2019 0450   CALCIUM 8.8 (L) 02/28/2019 0450   GFRNONAA >60 02/28/2019 0450   GFRAA >60 02/28/2019 0450    BNP    Component Value Date/Time   BNP 36.5 02/24/2019 0435    ProBNP No results found for: PROBNP  Imaging: No results found.   Assessment & Plan:   Acute respiratory distress syndrome (ARDS) due to COVID-19 virus - Feels well, clinically improved - Continues to have some mild decrease in stamina and hypoxia on exertion - Lungs clear, diminished. O2 sat 93% RA at rest.  - Advised patient continue 2L home oxygen on exertion and at night  - Needs repeat COVID testing - Needs repeat CXR at next office visit  - Needs PFT d/t hx tobacco use - pending negative covid testing  - Due for Pneumonia vaccine at next visit- pending negative covid testing  Snoring - Needs HST pending negative covid test   Needs to establish  with new pulmonary provider in 2-4 weeks (new patient visit) If unable to establish with MD needs Telephone visit or OV if negative covid with NP in 2 weeks   Glenford BayleyElizabeth W Annalie Wenner, NP 04/11/2019

## 2019-04-12 LAB — CBC WITH DIFFERENTIAL/PLATELET
Basophils Absolute: 0 10*3/uL (ref 0.0–0.1)
Basophils Relative: 0.6 % (ref 0.0–3.0)
Eosinophils Absolute: 0 10*3/uL (ref 0.0–0.7)
Eosinophils Relative: 0.5 % (ref 0.0–5.0)
HCT: 40.9 % (ref 36.0–46.0)
Hemoglobin: 13.8 g/dL (ref 12.0–15.0)
Lymphocytes Relative: 29.2 % (ref 12.0–46.0)
Lymphs Abs: 2 10*3/uL (ref 0.7–4.0)
MCHC: 33.8 g/dL (ref 30.0–36.0)
MCV: 92.7 fl (ref 78.0–100.0)
Monocytes Absolute: 0.3 10*3/uL (ref 0.1–1.0)
Monocytes Relative: 5 % (ref 3.0–12.0)
Neutro Abs: 4.5 10*3/uL (ref 1.4–7.7)
Neutrophils Relative %: 64.7 % (ref 43.0–77.0)
Platelets: 272 10*3/uL (ref 150.0–400.0)
RBC: 4.41 Mil/uL (ref 3.87–5.11)
RDW: 13.7 % (ref 11.5–15.5)
WBC: 6.9 10*3/uL (ref 4.0–10.5)

## 2019-04-12 LAB — COMPREHENSIVE METABOLIC PANEL
ALT: 14 U/L (ref 0–35)
AST: 15 U/L (ref 0–37)
Albumin: 4.2 g/dL (ref 3.5–5.2)
Alkaline Phosphatase: 90 U/L (ref 39–117)
BUN: 9 mg/dL (ref 6–23)
CO2: 34 mEq/L — ABNORMAL HIGH (ref 19–32)
Calcium: 9.5 mg/dL (ref 8.4–10.5)
Chloride: 98 mEq/L (ref 96–112)
Creatinine, Ser: 0.66 mg/dL (ref 0.40–1.20)
GFR: 88.86 mL/min (ref >60.00)
Glucose, Bld: 82 mg/dL (ref 70–99)
Potassium: 4.6 mEq/L (ref 3.5–5.1)
Sodium: 139 mEq/L (ref 135–145)
Total Bilirubin: 0.4 mg/dL (ref 0.2–1.2)
Total Protein: 7.1 g/dL (ref 6.0–8.3)

## 2019-04-12 LAB — SAR COV2 SEROLOGY (COVID19)AB(IGG),IA: SARS CoV2 AB IGG: POSITIVE — AB

## 2019-04-12 LAB — BRAIN NATRIURETIC PEPTIDE: Pro B Natriuretic peptide (BNP): 39 pg/mL (ref 0.0–100.0)

## 2019-04-17 ENCOUNTER — Other Ambulatory Visit: Payer: Self-pay | Admitting: Primary Care

## 2019-04-20 ENCOUNTER — Other Ambulatory Visit (HOSPITAL_COMMUNITY)
Admission: RE | Admit: 2019-04-20 | Discharge: 2019-04-20 | Disposition: A | Discharge status: Home / Self Care | Payer: Managed Care, Other (non HMO) | Source: Ambulatory Visit | Attending: Primary Care | Admitting: Primary Care

## 2019-04-20 DIAGNOSIS — Z1159 Encounter for screening for other viral diseases: Secondary | ICD-10-CM | POA: Insufficient documentation

## 2019-04-21 LAB — NOVEL CORONAVIRUS, NAA (HOSP ORDER, SEND-OUT TO REF LAB; TAT 18-24 HRS): SARS-CoV-2, NAA: NOT DETECTED

## 2019-04-21 NOTE — Progress Notes (Signed)
Sars-cov-2 not detected

## 2019-04-24 ENCOUNTER — Ambulatory Visit (INDEPENDENT_AMBULATORY_CARE_PROVIDER_SITE_OTHER): Payer: Managed Care, Other (non HMO)

## 2019-04-24 ENCOUNTER — Encounter: Payer: Self-pay | Admitting: Primary Care

## 2019-04-24 ENCOUNTER — Ambulatory Visit: Payer: Managed Care, Other (non HMO) | Admitting: Primary Care

## 2019-04-24 ENCOUNTER — Ambulatory Visit (INDEPENDENT_AMBULATORY_CARE_PROVIDER_SITE_OTHER): Payer: Managed Care, Other (non HMO) | Admitting: Pulmonary Disease

## 2019-04-24 ENCOUNTER — Other Ambulatory Visit: Payer: Self-pay

## 2019-04-24 ENCOUNTER — Other Ambulatory Visit: Payer: Self-pay | Admitting: Primary Care

## 2019-04-24 DIAGNOSIS — J449 Chronic obstructive pulmonary disease, unspecified: Secondary | ICD-10-CM | POA: Diagnosis not present

## 2019-04-24 DIAGNOSIS — U071 COVID-19: Secondary | ICD-10-CM | POA: Diagnosis not present

## 2019-04-24 DIAGNOSIS — J80 Acute respiratory distress syndrome: Secondary | ICD-10-CM

## 2019-04-24 DIAGNOSIS — R918 Other nonspecific abnormal finding of lung field: Secondary | ICD-10-CM

## 2019-04-24 DIAGNOSIS — R0683 Snoring: Secondary | ICD-10-CM | POA: Diagnosis not present

## 2019-04-24 LAB — PULMONARY FUNCTION TEST
DL/VA % pred: 93 %
DL/VA: 3.87 ml/min/mmHg/L
DLCO cor % pred: 76 %
DLCO cor: 15.52 ml/min/mmHg
DLCO unc % pred: 77 %
DLCO unc: 15.71 ml/min/mmHg
FEF 25-75 Post: 1.22 L/sec
FEF 25-75 Pre: 0.76 L/sec
FEF2575-%Change-Post: 62 %
FEF2575-%Pred-Post: 59 %
FEF2575-%Pred-Pre: 36 %
FEV1-%Change-Post: 29 %
FEV1-%Pred-Post: 61 %
FEV1-%Pred-Pre: 47 %
FEV1-Post: 1.5 L
FEV1-Pre: 1.16 L
FEV1FVC-%Change-Post: 24 %
FEV1FVC-%Pred-Pre: 73 %
FEV6-%Change-Post: 5 %
FEV6-%Pred-Post: 70 %
FEV6-%Pred-Pre: 67 %
FEV6-Post: 2.15 L
FEV6-Pre: 2.05 L
FEV6FVC-%Change-Post: 1 %
FEV6FVC-%Pred-Post: 104 %
FEV6FVC-%Pred-Pre: 103 %
FVC-%Change-Post: 4 %
FVC-%Pred-Post: 67 %
FVC-%Pred-Pre: 64 %
FVC-Post: 2.15 L
FVC-Pre: 2.07 L
Post FEV1/FVC ratio: 70 %
Post FEV6/FVC ratio: 100 %
Pre FEV1/FVC ratio: 56 %
Pre FEV6/FVC Ratio: 99 %
RV % pred: 135 %
RV: 2.99 L
TLC % pred: 95 %
TLC: 4.97 L

## 2019-04-24 MED ORDER — SPIRIVA RESPIMAT 1.25 MCG/ACT IN AERS: 2.0000 | INHALATION_SPRAY | Freq: Every day | RESPIRATORY_TRACT | 2 refills | Status: DC

## 2019-04-24 MED ORDER — SPIRIVA RESPIMAT 1.25 MCG/ACT IN AERS: 2.0000 | INHALATION_SPRAY | Freq: Every day | RESPIRATORY_TRACT | 6 refills | Status: AC

## 2019-04-24 NOTE — Assessment & Plan Note (Addendum)
-   S/p hospitalization from 02/15/19-03/01/19 for hypoxic respiratory failure secondary to SARS-COV-2 - COVID negative since 04/20/19 - Positive for antibodies, given name of [facility]  - Repeat CXR today

## 2019-04-24 NOTE — Progress Notes (Signed)
 @Patient  ID: Sherry Becker, female    DOB: 06/08/1951, 68 y.o.   MRN: [id number]  Chief Complaint  Patient presents with  . Follow-up    pft-    Referring provider: No ref. provider found  HPI: 68 year old female, former smoker quit 2018 (10 pack year hx). PMH significant for HLD, probable OSA. Patient hospitalized in April 2020 for hypoxic respiratory failure d/t acute Covid-19.   Hospital course 4/8-4/22: Presented to [facility] hospital on 4/8 with shortness of breath. Symptoms present for 4-5 weeks prior.  Patient works as Engineer, watertelephone receptionist. States that her co-worker was sick and passed away from unknown reason shortly before office closed. No known travel history. No cough or fever. In [facility] ED SpO2 34% on room air. Transferred to [facility] and was intubated for hypoxic respiratory failure. CXR showed extensive bilateral pulmonary infiltrates. SARS-CoV-2 detected. Treated with Plaquenil from 4/8-4/13 and received Actemra x2 doses. Extubated on 4/18. Repeat CXR on 4/18 showed lungs well aerated bilaterally, min scarring left base. No focal infiltrate. Discharged with home oxygen.   04/11/2019- Hospital follow-up Patient presents today for hospital follow-up. She is recovering and feeling well. She is currently on disability from work. She has some residual decreased stamina which she is working with physical therapy on. Reports oxygen desaturation to 88-90% on exertion but recovers quickly. Has home oxygen. She has been watching her weight and is down 13 lbs by trying to eat better. States that she has been eating 3-4 small meals a day. States that she has been sleeping pretty well. Wakes up 4 times a night to use the bathroom. Reports loud snoring, she has woken up in the past short of breath. She has never had a sleep test. Epworth score is 2. No fever, cough or wheezing  04/24/2019 Patient presents today for PFTs and OV. She is feeling well, continues to have some mild decreased stamina.  Repeat covid testing negative, positive for antibodies. Due for CXR today. Pulmonary function testing showed mild-moderate obstruction. No significant cough, wheezing or shortness of breath.   PFTs 04/24/2019 - FVC 2.15 (67%), FEV1 1.50 (61%), ratio 70, +BD response  No Known Allergies   There is no immunization history on file for this patient.  Past Medical History:  Diagnosis Date  . Depression   . Restless leg syndrome     Tobacco History: Social History   Tobacco Use  Smoking Status Former Smoker  . Packs/day: 1.00  . Years: 10.00  . Pack years: 10.00  . Types: Cigarettes  . Quit date: 04/10/2017  . Years since quitting: 2.0  Smokeless Tobacco Never Used   Counseling given: Not Answered   Outpatient Medications Prior to Visit  Medication Sig Dispense Refill  . DULoxetine (CYMBALTA) 20 MG capsule Take 20 mg by mouth daily.    Marland Kitchen. loperamide (IMODIUM) 2 MG capsule Take 2 mg by mouth as needed for diarrhea or loose stools.    . pseudoephedrine (SUDAFED) 30 MG tablet Take 30 mg by mouth every 4 (four) hours as needed for congestion.    . QUEtiapine (SEROQUEL) 200 MG tablet Take 200 mg by mouth daily.    Marland Kitchen. rOPINIRole (REQUIP) 2 MG tablet Take 2 mg by mouth 2 (two) times daily as needed.     . simvastatin (ZOCOR) 20 MG tablet Take 20 mg by mouth daily.     No facility-administered medications prior to visit.    Review of Systems  Review of Systems  Constitutional: Positive for fatigue.  HENT: Negative.   Respiratory: Negative for cough, shortness of breath and wheezing.   Cardiovascular: Negative.    Physical Exam  BP 134/86 (BP Location: Right Arm, Cuff Size: Large)   Pulse 84   Temp 98.7 F (37.1 C)   Ht 5\' 5"  (1.651 m)   Wt 281 lb (127.5 kg)   SpO2 92%   BMI 46.76 kg/m  Physical Exam Constitutional:      Appearance: Normal appearance. She is not ill-appearing.  HENT:     Head: Normocephalic.  Pulmonary:     Effort: Pulmonary effort is normal.      Breath sounds: No wheezing or rhonchi.  Musculoskeletal: Normal range of motion.  Skin:    General: Skin is warm and dry.  Neurological:     General: No focal deficit present.     Mental Status: She is alert and oriented to person, place, and time. Mental status is at baseline.  Psychiatric:        Mood and Affect: Mood normal.        Behavior: Behavior normal.        Thought Content: Thought content normal.        Judgment: Judgment normal.      Lab Results:  CBC    Component Value Date/Time   WBC 6.9 04/11/2019 1528   RBC 4.41 04/11/2019 1528   HGB 13.8 04/11/2019 1528   HCT 40.9 04/11/2019 1528   PLT 272.0 04/11/2019 1528   MCV 92.7 04/11/2019 1528   MCH 29.7 02/28/2019 0450   MCHC 33.8 04/11/2019 1528   RDW 13.7 04/11/2019 1528   LYMPHSABS 2.0 04/11/2019 1528   MONOABS 0.3 04/11/2019 1528   EOSABS 0.0 04/11/2019 1528   BASOSABS 0.0 04/11/2019 1528    BMET    Component Value Date/Time   NA 139 04/11/2019 1528   K 4.6 04/11/2019 1528   CL 98 04/11/2019 1528   CO2 34 (H) 04/11/2019 1528   GLUCOSE 82 04/11/2019 1528   BUN 9 04/11/2019 1528   CREATININE 0.66 04/11/2019 1528   CALCIUM 9.5 04/11/2019 1528   GFRNONAA >60 02/28/2019 0450   GFRAA >60 02/28/2019 0450    BNP    Component Value Date/Time   BNP 36.5 02/24/2019 0435    ProBNP    Component Value Date/Time   PROBNP 39.0 04/11/2019 1528    Imaging: No results found.   Assessment & Plan:   COPD (chronic obstructive pulmonary disease) ([place]) - PFTs 04/24/2019 - FVC 2.15 (67%), FEV1 1.50 (61%), ratio 70, +BD response - Former smoker quit in 2018 - Trial Spiriva respimat 1.52mcg - FU in 3-4 months with new [place] pulmonary doctor    Acute respiratory distress syndrome (ARDS) due to COVID-19 virus - S/p hospitalization from 02/15/19-03/01/19 for hypoxic respiratory failure secondary to SARS-COV-2 - COVID negative since 04/20/19 - Positive for antibodies, given name of [facility]  -  Repeat CXR today  Snoring - Needs HST   Martyn Ehrich, NP 04/24/2019

## 2019-04-24 NOTE — Assessment & Plan Note (Signed)
-   PFTs 04/24/2019 - FVC 2.15 (67%), FEV1 1.50 (61%), ratio 70, +BD response - Former smoker quit in 2018 - Trial Spiriva respimat 1.38mcg - FU in 3-4 months with new [place] pulmonary doctor

## 2019-04-24 NOTE — Assessment & Plan Note (Signed)
-   Needs HST

## 2019-04-24 NOTE — Patient Instructions (Addendum)
  PFTs showed mild-moderate obstruction consistent with COPD  CXR today  Start Spiriva - take 2 puffs once daily  Please provide note to patient that states she was hospitalized from 4/8-4/22 for hypoxic respiratory failure secondary to positive SARS-COV-2  Follow up in 3-4 months with new [place] pulmonary provider (new pt)

## 2019-04-25 ENCOUNTER — Telehealth: Payer: Self-pay | Admitting: Primary Care

## 2019-04-25 DIAGNOSIS — U071 COVID-19: Secondary | ICD-10-CM

## 2019-04-25 NOTE — Telephone Encounter (Signed)
Please let patient know repeat CXR showed mild reticular opacities may reflect combination of chronic changes. No large confluent airspace disease or evidence of lobar pneumonia. Recommend repeating CXR in 4-6 weeks. Please order. Thanks

## 2019-04-25 NOTE — Telephone Encounter (Signed)
Just CXR

## 2019-04-25 NOTE — Telephone Encounter (Signed)
Covid patient. Doing well clinically. Repeat CXR showed mild reticular opacities may reflect combination of chronic changes and/or multifocal pneumonia given the history. No large confluent airspace disease or evidence of lobar pneumonia.  Do you recommend repeating another CXR in another 4 weeks or CT?  -beth

## 2019-04-25 NOTE — Progress Notes (Signed)
PFT performed today. 

## 2019-04-26 NOTE — Telephone Encounter (Signed)
Relayed results to pt.  Pt asked about disability for decreased stamina and strength.  BW said about another 2-4 weeks as our test are coming back cleared.  Xray order placed.  Nothing further is needed at this time.

## 2019-04-27 ENCOUNTER — Telehealth: Payer: Self-pay | Admitting: Primary Care

## 2019-04-27 NOTE — Telephone Encounter (Signed)
I have left a message with Levada Dy with [facility]. I have given the verbal order for a pulse ox for home use. Nothing further was needed.

## 2019-05-26 ENCOUNTER — Telehealth: Payer: Self-pay | Admitting: Primary Care

## 2019-05-26 NOTE — Telephone Encounter (Signed)
Primary Pulmonologist: Arabi office visit and with whom: 04/24/2019 with Beth What do we see them for (pulmonary problems): COPD/pulm infiltrates Last OV assessment/plan: Instructions    Return in about 3 months (around 07/25/2019).  PFTs showed mild-moderate obstruction consistent with COPD  CXR today  Start Spiriva - take 2 puffs once daily  Please provide note to patient that states she was hospitalized from 4/8-4/22 for hypoxic respiratory failure secondary to positive SARS-COV-2  Follow up in 3-4 months with new [facility] pulmonary provider (new pt)      Was appointment offered to patient (explain)?  Pt wants new med   Reason for call: Called and spoke with pt who states she does not believe the spiriva is working for her. Pt states that she is doing okay but when using the spiriva, she does not feel like it is working for her. Pt is wanting to know if there is a different inhaler that could be prescribed in place of the spiriva to see if she can tell any difference. Tammy, please advise on this for pt. Thanks!

## 2019-05-26 NOTE — Telephone Encounter (Signed)
Called and spoke with pt letting her know that TP said to schedule a visit to further discuss changing inhaler and pt verbalized understanding. Pt has been scheduled for OV with Beth 7/22 at 2:30. Nothing further needed.

## 2019-05-26 NOTE — Telephone Encounter (Signed)
Would set up for visit next week in office for visit  To discuss issues and change of inhaler   Please contact office for sooner follow up if symptoms do not improve or worsen or seek emergency care

## 2019-05-31 ENCOUNTER — Other Ambulatory Visit: Payer: Self-pay

## 2019-05-31 ENCOUNTER — Encounter: Payer: Self-pay | Admitting: *Deleted

## 2019-05-31 ENCOUNTER — Ambulatory Visit: Payer: Managed Care, Other (non HMO) | Admitting: Primary Care

## 2019-05-31 ENCOUNTER — Encounter: Payer: Self-pay | Admitting: Primary Care

## 2019-05-31 VITALS — BP 138/80 | HR 98 | Temp 98.1°F | Ht 65.0 in | Wt 289.4 lb

## 2019-05-31 DIAGNOSIS — R0683 Snoring: Secondary | ICD-10-CM | POA: Diagnosis not present

## 2019-05-31 DIAGNOSIS — R918 Other nonspecific abnormal finding of lung field: Secondary | ICD-10-CM | POA: Diagnosis not present

## 2019-05-31 DIAGNOSIS — Z23 Encounter for immunization: Secondary | ICD-10-CM

## 2019-05-31 DIAGNOSIS — U071 COVID-19: Secondary | ICD-10-CM

## 2019-05-31 MED ORDER — ANORO ELLIPTA 62.5-25 MCG/INH IN AEPB: 1.0000 | INHALATION_SPRAY | Freq: Every day | RESPIRATORY_TRACT | 0 refills | Status: DC

## 2019-05-31 NOTE — Progress Notes (Signed)
 @Patient  ID: Sherry Becker, female    DOB: 12/04/1950, 68 y.o.   MRN: [id number]  Chief Complaint  Patient presents with  . Follow-up    doesn't feel spiriva works, would like a new inhaler, needs work note to return to work Aug 1, stated she is feeling good    Referring provider: No ref. provider found  HPI: 68 year old female, former smoker quit 2018 (10 pack year hx). PMH significant for HLD, probable OSA. Patient hospitalized in April 2020 for hypoxic respiratory failure d/t acute Covid-19.   Hospital course 4/8-4/22: Presented to [facility] on 4[facility]8 with shortness of breath. Symptoms present for 4-5 weeks prior.  Patient works as Engineer, watertelephone receptionist. States that her co-worker was sick and passed away from unknown reason shortly before office closed. No known travel history. No cough or fever. In [facility] ED SpO2 34% on room air. [facility] and was intubated for hypoxic respiratory failure. CXR showed extensive bilateral pulmonary infiltrates. SARS-CoV-2 detected. Treated with Plaquenil from 4/8-4/13 and received Actemra x2 doses. Extubated on 4/18. Repeat CXR on 4/18 showed lungs well aerated bilaterally, min scarring left base. No focal infiltrate. Discharged with home oxygen.   04/11/2019- Hospital follow-up Patient presents today for hospital follow-up. She is recovering and feeling well. She is currently on disability from work. She has some residual decreased stamina which she is working with physical therapy on. Reports oxygen desaturation to 88-90% on exertion but recovers quickly. Has home oxygen. She has been watching her weight and is down 13 lbs by trying to eat better. States that she has been eating 3-4 small meals a day. States that she has been sleeping pretty well. Wakes up 4 times a night to use the bathroom. Reports loud snoring, she has woken up in the past short of breath. She has never had a sleep test. Epworth score is 2. No fever, cough or wheezing   04/24/2019 Patient presents today for PFTs and OV. She is feeling well, continues to have some mild decreased stamina. Repeat covid testing negative, positive for antibodies. Due for CXR today. Pulmonary function testing showed mild-moderate obstruction. No significant cough, wheezing or shortness of breath. Repeat CXR showed mild reticular opacities may reflect combination of chronic changes. No large confluent airspace disease or evidence of lobar pneumonia. Recommend repeating CXR in 4-6 weeks  05/31/2019  Patient returns today for 1 month follow-up. She is doing well overall, breathing has improved. Reports no improvement from Spiriva. Gets short of breath with outdoor activity especially in the heat. She is not currently using oxygen. Ready to go back to work. She has not called the plasma center about donating. She is due for pneumonia vaccine. Still needs home sleep test.    PFTs 04/24/2019 - FVC 2.15 (67%), FEV1 1.50 (61%), ratio 70, +BD response  No Known Allergies   There is no immunization history on file for this patient.  Past Medical History:  Diagnosis Date  . Depression   . Restless leg syndrome     Tobacco History: Social History   Tobacco Use  Smoking Status Former Smoker  . Packs/day: 1.00  . Years: 10.00  . Pack years: 10.00  . Types: Cigarettes  . Quit date: 04/10/2017  . Years since quitting: 2.1  Smokeless Tobacco Never Used   Counseling given: Not Answered   Outpatient Medications Prior to Visit  Medication Sig Dispense Refill  . DULoxetine (CYMBALTA) 20 MG capsule Take 20 mg by mouth daily.    .Marland Kitchen  loperamide (IMODIUM) 2 MG capsule Take 2 mg by mouth as needed for diarrhea or loose stools.    . pseudoephedrine (SUDAFED) 30 MG tablet Take 30 mg by mouth every 4 (four) hours as needed for congestion.    . QUEtiapine (SEROQUEL) 200 MG tablet Take 200 mg by mouth daily.    Marland Kitchen rOPINIRole (REQUIP) 2 MG tablet Take 2 mg by mouth 2 (two) times daily as needed.      . simvastatin (ZOCOR) 20 MG tablet Take 20 mg by mouth daily.    . Tiotropium Bromide Monohydrate (SPIRIVA RESPIMAT) 1.25 MCG/ACT AERS Inhale 2 puffs into the lungs daily. 4 g 6  . Tiotropium Bromide Monohydrate (SPIRIVA RESPIMAT) 1.25 MCG/ACT AERS Inhale 2 puffs into the lungs daily. 4 g 2   No facility-administered medications prior to visit.    Review of Systems  Review of Systems  Constitutional: Negative.   HENT: Negative.   Respiratory: Positive for shortness of breath. Negative for cough and wheezing.   Cardiovascular: Negative.    Physical Exam  BP 138/80 (BP Location: Left Arm, Cuff Size: Large)   Pulse 98   Temp 98.1 F (36.7 C) (Oral)   Ht 5\' 5"  (1.651 m)   Wt 289 lb 6.4 oz (131.3 kg)   SpO2 96%   BMI 48.16 kg/m  Physical Exam Constitutional:      Appearance: Normal appearance. She is obese.  HENT:     Head: Normocephalic and atraumatic.  Neck:     Musculoskeletal: Normal range of motion and neck supple.  Cardiovascular:     Rate and Rhythm: Normal rate and regular rhythm.  Pulmonary:     Effort: Pulmonary effort is normal.     Breath sounds: Normal breath sounds.  Neurological:     General: No focal deficit present.     Mental Status: She is alert and oriented to person, place, and time. Mental status is at baseline.  Psychiatric:        Mood and Affect: Mood normal.        Behavior: Behavior normal.        Thought Content: Thought content normal.        Judgment: Judgment normal.      Lab Results:  CBC    Component Value Date/Time   WBC 6.9 04/11/2019 1528   RBC 4.41 04/11/2019 1528   HGB 13.8 04/11/2019 1528   HCT 40.9 04/11/2019 1528   PLT 272.0 04/11/2019 1528   MCV 92.7 04/11/2019 1528   MCH 29.7 02/28/2019 0450   MCHC 33.8 04/11/2019 1528   RDW 13.7 04/11/2019 1528   LYMPHSABS 2.0 04/11/2019 1528   MONOABS 0.3 04/11/2019 1528   EOSABS 0.0 04/11/2019 1528   BASOSABS 0.0 04/11/2019 1528    BMET    Component Value Date/Time   NA  139 04/11/2019 1528   K 4.6 04/11/2019 1528   CL 98 04/11/2019 1528   CO2 34 (H) 04/11/2019 1528   GLUCOSE 82 04/11/2019 1528   BUN 9 04/11/2019 1528   CREATININE 0.66 04/11/2019 1528   CALCIUM 9.5 04/11/2019 1528   GFRNONAA >60 02/28/2019 0450   GFRAA >60 02/28/2019 0450    BNP    Component Value Date/Time   BNP 36.5 02/24/2019 0435    ProBNP    Component Value Date/Time   PROBNP 39.0 04/11/2019 1528    Imaging: No results found.   Assessment & Plan:   COPD (chronic obstructive pulmonary disease) ([place]) - 04/24/2019 - FVC 2.15 (  67%), FEV1 1.50 (61%), ratio 70, +BD response - Former smoker - No improvement with spiriva; trial Anoro 1 puff daily - FU in 2-3 months with new [facility] pulmonary provider    Pulmonary infiltrates - CXR 6/16/2- showed mild reticular opacities may reflect combination of chronic changes and/or multifocal pneumonia given the history. No large confluent airspace disease or evidence of lobar pneumonia - Recommend repeating CXR at 6 weeks (06/06/19)  COVID-19 virus infection - Clinically improved to baseline  - Sars COV2 serology positive  - Given name and contact number for [facility]   Snoring - Reports loud snoring - Needs HST  - Epworth score is 2   Glenford BayleyElizabeth W Hendryx Ricke, NP 05/31/2019

## 2019-05-31 NOTE — Assessment & Plan Note (Signed)
-   04/24/2019 - FVC 2.15 (67%), FEV1 1.50 (61%), ratio 70, +BD response - Former smoker - No improvement with spiriva; trial Anoro 1 puff daily - FU in 2-3 months with new [facility] pulmonary provider

## 2019-05-31 NOTE — Assessment & Plan Note (Signed)
-   Reports loud snoring - Needs HST  - Epworth score is 2

## 2019-05-31 NOTE — Assessment & Plan Note (Addendum)
-   Clinically improved to baseline  - Sars COV2 serology positive  - Given name and contact number for [facility]

## 2019-05-31 NOTE — Patient Instructions (Addendum)
Stop Spiriva Trial Anoro- 1 puff daily (sample given, if helpful call and let us know to send prescription) Needs home sleep test - we are back logged current with testing Cancel 7/31 apt with me, please scheduled patient new visit with Dr. Delton CoombesByrum, Mann[place] or Icard (2-3 months) Repeat CXR in 1 week       Pneumococcal Vaccine, Polyvalent solution for injection What is this medicine? PNEUMOCOCCAL VACCINE, POLYVALENT (NEU mo KOK al vak SEEN, pol ee VEY luhnt) is a vaccine to prevent pneumococcus bacteria infection. These bacteria are a major cause of ear infections, Strep throat infections, and serious pneumonia, meningitis, or blood infections worldwide. These vaccines help the body to produce antibodies (protective substances) that help your body defend against these bacteria. This vaccine is recommended for people 582 years of age and older with health problems. It is also recommended for all adults over 69 years old. This vaccine will not treat an infection. This medicine may be used for other purposes; ask your health care provider or pharmacist if you have questions. COMMON BRAND NAME(S): Pneumovax 23 What should I tell my health care provider before I take this medicine? They need to know if you have any of these conditions:  bleeding problems  bone marrow or organ transplant  cancer, Hodgkin's disease  fever  infection  immune system problems  low platelet count in the blood  seizures  an unusual or allergic reaction to pneumococcal vaccine, diphtheria toxoid, other vaccines, latex, other medicines, foods, dyes, or preservatives  pregnant or trying to get pregnant  breast-feeding How should I use this medicine? This vaccine is for injection into a muscle or under the skin. It is given by a health care professional. A copy of Vaccine Information Statements will be given before each vaccination. Read this sheet carefully each time. The sheet may change frequently. Talk to  your pediatrician regarding the use of this medicine in children. While this drug may be prescribed for children as young as 572 years of age for selected conditions, precautions do apply. Overdosage: If you think you have taken too much of this medicine contact a poison control center or emergency room at once. NOTE: This medicine is only for you. Do not share this medicine with others. What if I miss a dose? It is important not to miss your dose. Call your doctor or health care professional if you are unable to keep an appointment. What may interact with this medicine?  medicines for cancer chemotherapy  medicines that suppress your immune function  medicines that treat or prevent blood clots like warfarin, enoxaparin, and dalteparin  steroid medicines like prednisone or cortisone This list may not describe all possible interactions. Give your health care provider a list of all the medicines, herbs, non-prescription drugs, or dietary supplements you use. Also tell them if you smoke, drink alcohol, or use illegal drugs. Some items may interact with your medicine. What should I watch for while using this medicine? Mild fever and pain should go away in 3 days or less. Report any unusual symptoms to your doctor or health care professional. What side effects may I notice from receiving this medicine? Side effects that you should report to your doctor or health care professional as soon as possible:  allergic reactions like skin rash, itching or hives, swelling of the face, lips, or tongue  breathing problems  confused  fever over 102 degrees F  pain, tingling, numbness in the hands or feet  seizures  unusual  bleeding or bruising  unusual muscle weakness Side effects that usually do not require medical attention (report to your doctor or health care professional if they continue or are bothersome):  aches and pains  diarrhea  fever of 102 degrees F or  less  headache  irritable  loss of appetite  pain, tender at site where injected  trouble sleeping This list may not describe all possible side effects. Call your doctor for medical advice about side effects. You may report side effects to FDA at 1-800-FDA-1088. Where should I keep my medicine? This does not apply. This vaccine is given in a clinic, pharmacy, doctor's office, or other health care setting and will not be stored at home. NOTE: This sheet is a summary. It may not cover all possible information. If you have questions about this medicine, talk to your doctor, pharmacist, or health care provider.  2020 Elsevier/Gold Standard (2008-06-01 14:32:37)

## 2019-05-31 NOTE — Assessment & Plan Note (Addendum)
-   CXR 6/16/2- showed mild reticular opacities may reflect combination of chronic changes and/or multifocal pneumonia given the history. No large confluent airspace disease or evidence of lobar pneumonia - Recommend repeating CXR at 6 weeks (06/06/19)

## 2019-06-09 ENCOUNTER — Ambulatory Visit: Payer: Managed Care, Other (non HMO) | Admitting: Primary Care

## 2019-06-14 ENCOUNTER — Ambulatory Visit: Payer: Managed Care, Other (non HMO)

## 2019-06-14 ENCOUNTER — Other Ambulatory Visit: Payer: Self-pay

## 2019-06-14 DIAGNOSIS — G4733 Obstructive sleep apnea (adult) (pediatric): Secondary | ICD-10-CM

## 2019-06-14 DIAGNOSIS — R0683 Snoring: Secondary | ICD-10-CM

## 2019-06-16 ENCOUNTER — Telehealth: Payer: Self-pay | Admitting: Primary Care

## 2019-06-16 DIAGNOSIS — G4733 Obstructive sleep apnea (adult) (pediatric): Secondary | ICD-10-CM

## 2019-06-16 NOTE — Telephone Encounter (Signed)
Spoke with the pt and notified of recs per Dr Halford Chessman  She verbalized understanding  She agrees to have the CPAP titration  Order was sent

## 2019-06-16 NOTE — Telephone Encounter (Signed)
HST 06/14/19 >> AHI 45.9, SpO2 low 77%.  Spent 323.5 minutes with SpO2 < 89%.   Please inform her that her sleep study shows severe obstructive sleep apnea and very low oxygen levels at night.  She should keep home oxygen set up for now.  She needs to have in lab CPAP titration study to determine if she just needs CPAP therapy or if she needs CPAP with oxygen at night.  If she is agreeable to CPAP titration study, then please place order.  Otherwise, please arrange for ROV with me or Derl Barrow to discuss treatment options in more detail.

## 2019-06-28 ENCOUNTER — Encounter (HOSPITAL_BASED_OUTPATIENT_CLINIC_OR_DEPARTMENT_OTHER): Payer: Managed Care, Other (non HMO) | Admitting: Pulmonary Disease

## 2019-06-29 NOTE — Progress Notes (Signed)
Reviewed and agree with assessment/plan.   Randie Tallarico, MD [place] [facility] 11/04/2016, 12:24 PM Pager:  [phone]  

## 2019-07-01 NOTE — Progress Notes (Signed)
Reviewed and agree with assessment/plan.   Memorie Yokoyama, MD [place] [facility] 11/04/2016, 12:24 PM Pager:  [phone]  

## 2019-07-03 ENCOUNTER — Other Ambulatory Visit (HOSPITAL_COMMUNITY)
Admission: RE | Admit: 2019-07-03 | Discharge: 2019-07-03 | Disposition: A | Discharge status: Home / Self Care | Payer: Managed Care, Other (non HMO) | Source: Ambulatory Visit | Attending: Pulmonary Disease | Admitting: Pulmonary Disease

## 2019-07-03 DIAGNOSIS — Z20828 Contact with and (suspected) exposure to other viral communicable diseases: Secondary | ICD-10-CM | POA: Insufficient documentation

## 2019-07-03 DIAGNOSIS — Z01812 Encounter for preprocedural laboratory examination: Secondary | ICD-10-CM | POA: Insufficient documentation

## 2019-07-04 LAB — SARS CORONAVIRUS 2 (TAT 6-24 HRS): SARS Coronavirus 2: NEGATIVE

## 2019-07-06 ENCOUNTER — Ambulatory Visit (HOSPITAL_BASED_OUTPATIENT_CLINIC_OR_DEPARTMENT_OTHER): Payer: Managed Care, Other (non HMO) | Attending: Pulmonary Disease | Admitting: Pulmonary Disease

## 2019-07-06 ENCOUNTER — Other Ambulatory Visit: Payer: Self-pay

## 2019-07-06 VITALS — Ht 65.0 in | Wt 280.0 lb

## 2019-07-06 DIAGNOSIS — G4733 Obstructive sleep apnea (adult) (pediatric): Secondary | ICD-10-CM

## 2019-07-06 NOTE — Procedures (Signed)
Pt started vomiting  And went home.

## 2019-07-07 ENCOUNTER — Telehealth: Payer: Self-pay | Admitting: Pulmonary Disease

## 2019-07-07 NOTE — Telephone Encounter (Signed)
Sound good

## 2019-07-07 NOTE — Telephone Encounter (Signed)
Pt says she had dinner and then went to have study done. She states out of nowhere she felt sick. She says she has not had vomiting in 59yrs. Pt is fine this morning and feels it was just something she ate. Pt declines an appt. Pt will get sleep study rescheduled. Nothing further needed.

## 2019-07-07 NOTE — Telephone Encounter (Signed)
Thanks, keep me updated

## 2019-07-07 NOTE — Telephone Encounter (Signed)
ATC pt No answer nor machine. Will try again later.

## 2019-07-07 NOTE — Telephone Encounter (Signed)
Received message from sleep lab that patient was to have CPAP titration study last night.  However, it appears she had vomiting episode and left sleep lab.  Attempted to contact patient at listed number, but no answer.  Will route to Geraldo Pitter and Triage to f/u with patient to determine if she needs ROV.

## 2019-07-07 NOTE — Procedures (Signed)
    Sleep study aborted due to patient not feeling well and started vomiting.  Chesley Mires, MD [facility] Pulmonary/Critical Care 07/07/2019, 8:43 AM

## 2019-07-11 ENCOUNTER — Telehealth: Payer: Self-pay | Admitting: Pulmonary Disease

## 2019-07-11 NOTE — Telephone Encounter (Signed)
We can discontinue after sleep study if it shows no desaturation

## 2019-07-11 NOTE — Telephone Encounter (Signed)
Spoke with pt, she is requesting an order to be sent to the DME to discontinue oxygen that she uses at night. She states she is having a sleep study and has not been using the oxygen at night at all so she would like for them to come get it. From previous notes she was getting a cpap titration to see if she needed oxygen at night with her CPAP. BW please advise.

## 2019-07-11 NOTE — Telephone Encounter (Signed)
Called pt and advised message from the provider. Pt understood and verbalized understanding. Nothing further is needed.    

## 2019-07-27 ENCOUNTER — Other Ambulatory Visit (HOSPITAL_COMMUNITY)
Admission: RE | Admit: 2019-07-27 | Discharge: 2019-07-27 | Disposition: A | Discharge status: Home / Self Care | Payer: Managed Care, Other (non HMO) | Source: Ambulatory Visit | Attending: Pulmonary Disease | Admitting: Pulmonary Disease

## 2019-07-27 DIAGNOSIS — Z01812 Encounter for preprocedural laboratory examination: Secondary | ICD-10-CM | POA: Diagnosis present

## 2019-07-27 DIAGNOSIS — Z20828 Contact with and (suspected) exposure to other viral communicable diseases: Secondary | ICD-10-CM | POA: Diagnosis not present

## 2019-07-28 ENCOUNTER — Telehealth: Payer: Self-pay | Admitting: Primary Care

## 2019-07-28 DIAGNOSIS — J449 Chronic obstructive pulmonary disease, unspecified: Secondary | ICD-10-CM

## 2019-07-28 DIAGNOSIS — R918 Other nonspecific abnormal finding of lung field: Secondary | ICD-10-CM

## 2019-07-28 LAB — NOVEL CORONAVIRUS, NAA (HOSP ORDER, SEND-OUT TO REF LAB; TAT 18-24 HRS): SARS-CoV-2, NAA: NOT DETECTED

## 2019-07-28 MED ORDER — ANORO ELLIPTA 62.5-25 MCG/INH IN AEPB: 1.0000 | INHALATION_SPRAY | Freq: Every day | RESPIRATORY_TRACT | 2 refills | Status: AC

## 2019-07-28 NOTE — Telephone Encounter (Signed)
Based on last office visit on 05/31/19, the patient was given a sample of Anoro and directed to call so a prescription can be sent to pharmacy.  Prescription sent. Called patient to make her aware. Nothing further needed at this time.

## 2019-07-30 ENCOUNTER — Other Ambulatory Visit: Payer: Self-pay

## 2019-07-30 ENCOUNTER — Ambulatory Visit (HOSPITAL_BASED_OUTPATIENT_CLINIC_OR_DEPARTMENT_OTHER): Payer: Managed Care, Other (non HMO) | Attending: Pulmonary Disease | Admitting: Pulmonary Disease

## 2019-07-30 VITALS — Ht 65.0 in | Wt 280.0 lb

## 2019-07-30 DIAGNOSIS — R0902 Hypoxemia: Secondary | ICD-10-CM | POA: Insufficient documentation

## 2019-07-30 DIAGNOSIS — G4734 Idiopathic sleep related nonobstructive alveolar hypoventilation: Secondary | ICD-10-CM

## 2019-07-30 DIAGNOSIS — G4733 Obstructive sleep apnea (adult) (pediatric): Secondary | ICD-10-CM | POA: Diagnosis not present

## 2019-08-03 ENCOUNTER — Telehealth: Payer: Self-pay | Admitting: Pulmonary Disease

## 2019-08-03 DIAGNOSIS — G4733 Obstructive sleep apnea (adult) (pediatric): Secondary | ICD-10-CM | POA: Diagnosis not present

## 2019-08-03 NOTE — Procedures (Signed)
    Patient Name: Sherry Becker, Sherry Becker Date: 07/30/2019 Gender: Female D.O.B: 03-20-1951 Age (years): 38 Referring Provider: Chesley Mires MD, ABSM Height (inches): 65 Interpreting Physician: Chesley Mires MD, ABSM Weight (lbs): 280 RPSGT: Earney [place] BMI: 47 MRN: [id number] Neck Size: 18.00  CLINICAL INFORMATION The patient is referred for a CPAP titration to treat sleep apnea.  Date of HST: 06/14/19 >> AHI 45.9, SpO2 low 77%.  Spent 323.5 minutes with SpO2 < 89%.  SLEEP STUDY TECHNIQUE As per the AASM Manual for the Scoring of Sleep and Associated Events v2.3 (April 2016) with a hypopnea requiring 4% desaturations.  The channels recorded and monitored were frontal, central and occipital EEG, electrooculogram (EOG), submentalis EMG (chin), nasal and oral airflow, thoracic and abdominal wall motion, anterior tibialis EMG, snore microphone, electrocardiogram, and pulse oximetry. Continuous positive airway pressure (CPAP) was initiated at the beginning of the study and titrated to treat sleep-disordered breathing.  MEDICATIONS Medications self-administered by patient taken the night of the study : QUETIAPINE FUMARATE, [name] Comments added by technician: NONE Comments added by scorer: N/A  RESPIRATORY PARAMETERS Optimal PAP Pressure (cm): 8 AHI at Optimal Pressure (/hr): 0.0 Overall Minimal O2 (%): 81.0 Supine % at Optimal Pressure (%): 0 Minimal O2 at Optimal Pressure (%): 88.0   She had good control of her obstructive respiratory events with CPAP 8 cm H2O.  She had continued oxygen desaturation lasting more than 5 minutes in the abscence of other respiratory events.  This resolved with the addition of 2 liters oxygen and CPAP 8 cm H2O.  SLEEP ARCHITECTURE The study was initiated at 11:06:51 PM and ended at 5:19:51 AM.  Sleep onset time was 32.3 minutes and the sleep efficiency was 89.6%%. The total sleep time was 334.2 minutes.  The patient spent  0.3%% of the night in stage N1 sleep, 50.6%% in stage N2 sleep, 0.0%% in stage N3 and 49.1% in REM.Stage REM latency was 42.0 minutes  Wake after sleep onset was 6.5. Alpha intrusion was absent. Supine sleep was 70.42%.  CARDIAC DATA The 2 lead EKG demonstrated sinus rhythm. The mean heart rate was 72.6 beats per minute. Other EKG findings include: None.  LEG MOVEMENT DATA The total Periodic Limb Movements of Sleep (PLMS) were 0. The PLMS index was 0.0. A PLMS index of <15 is considered normal in adults.  IMPRESSIONS - She did well with CPAP 8 cm H2O. - She required addition of 2 liters oxygen to CPAP 8 cm H2O.  DIAGNOSIS - Obstructive Sleep Apnea. - Nocturnal Hypoxemia.  RECOMMENDATIONS - Trial of CPAP therapy on 8 cm H2O with 2 liters supplemental oxygen. - She was fitted with a Small size Fisher&Paykel Nasal Mask Eson mask and heated humidification.  [Electronically signed] 08/03/2019 02:13 PM  Chesley Mires MD, [place], American Board of Sleep Medicine   NPI: [id number]

## 2019-08-03 NOTE — Telephone Encounter (Signed)
ATC patient unable to reach LM to call back office (x1)  

## 2019-08-03 NOTE — Telephone Encounter (Signed)
CPAP titration 07/30/19 >> CPAP 8 cm H2O with 2 liters O2 >> AHI 0.   Please let her know that she did well during sleep study, and will need CPAP and supplemental oxygen set up at home.  Please schedule an ROV with me to discuss findings and review treatment options in more detail.

## 2019-08-24 NOTE — Telephone Encounter (Signed)
Message routed to Dr. Halford Chessman as Sherry Becker:  Unable to reach patient on home # listed. Called patient on cell number and she stated she moved to [place] two weeks ago. She is in the process of getting a new sleep doctor next week. I asked the patient to make sure that she signs a record release form with that practice so her notes from our office can be sent in order to continue her care.  Patient voiced understanding. Nothing further needed at this time.

## 2019-08-24 NOTE — Telephone Encounter (Signed)
Please try to contact patient again to schedule ROV to review sleep study findings.

## 2019-08-25 NOTE — Telephone Encounter (Signed)
Noted  

## 2019-11-21 IMAGING — DX PORTABLE CHEST - 1 VIEW
1 series · 1 of 1 positions shown · non-contrast
Comparison: 02/18/2019

CLINICAL DATA: Respiratory failure.  [id number]

EXAM:
PORTABLE CHEST 1 VIEW

[chest ap]
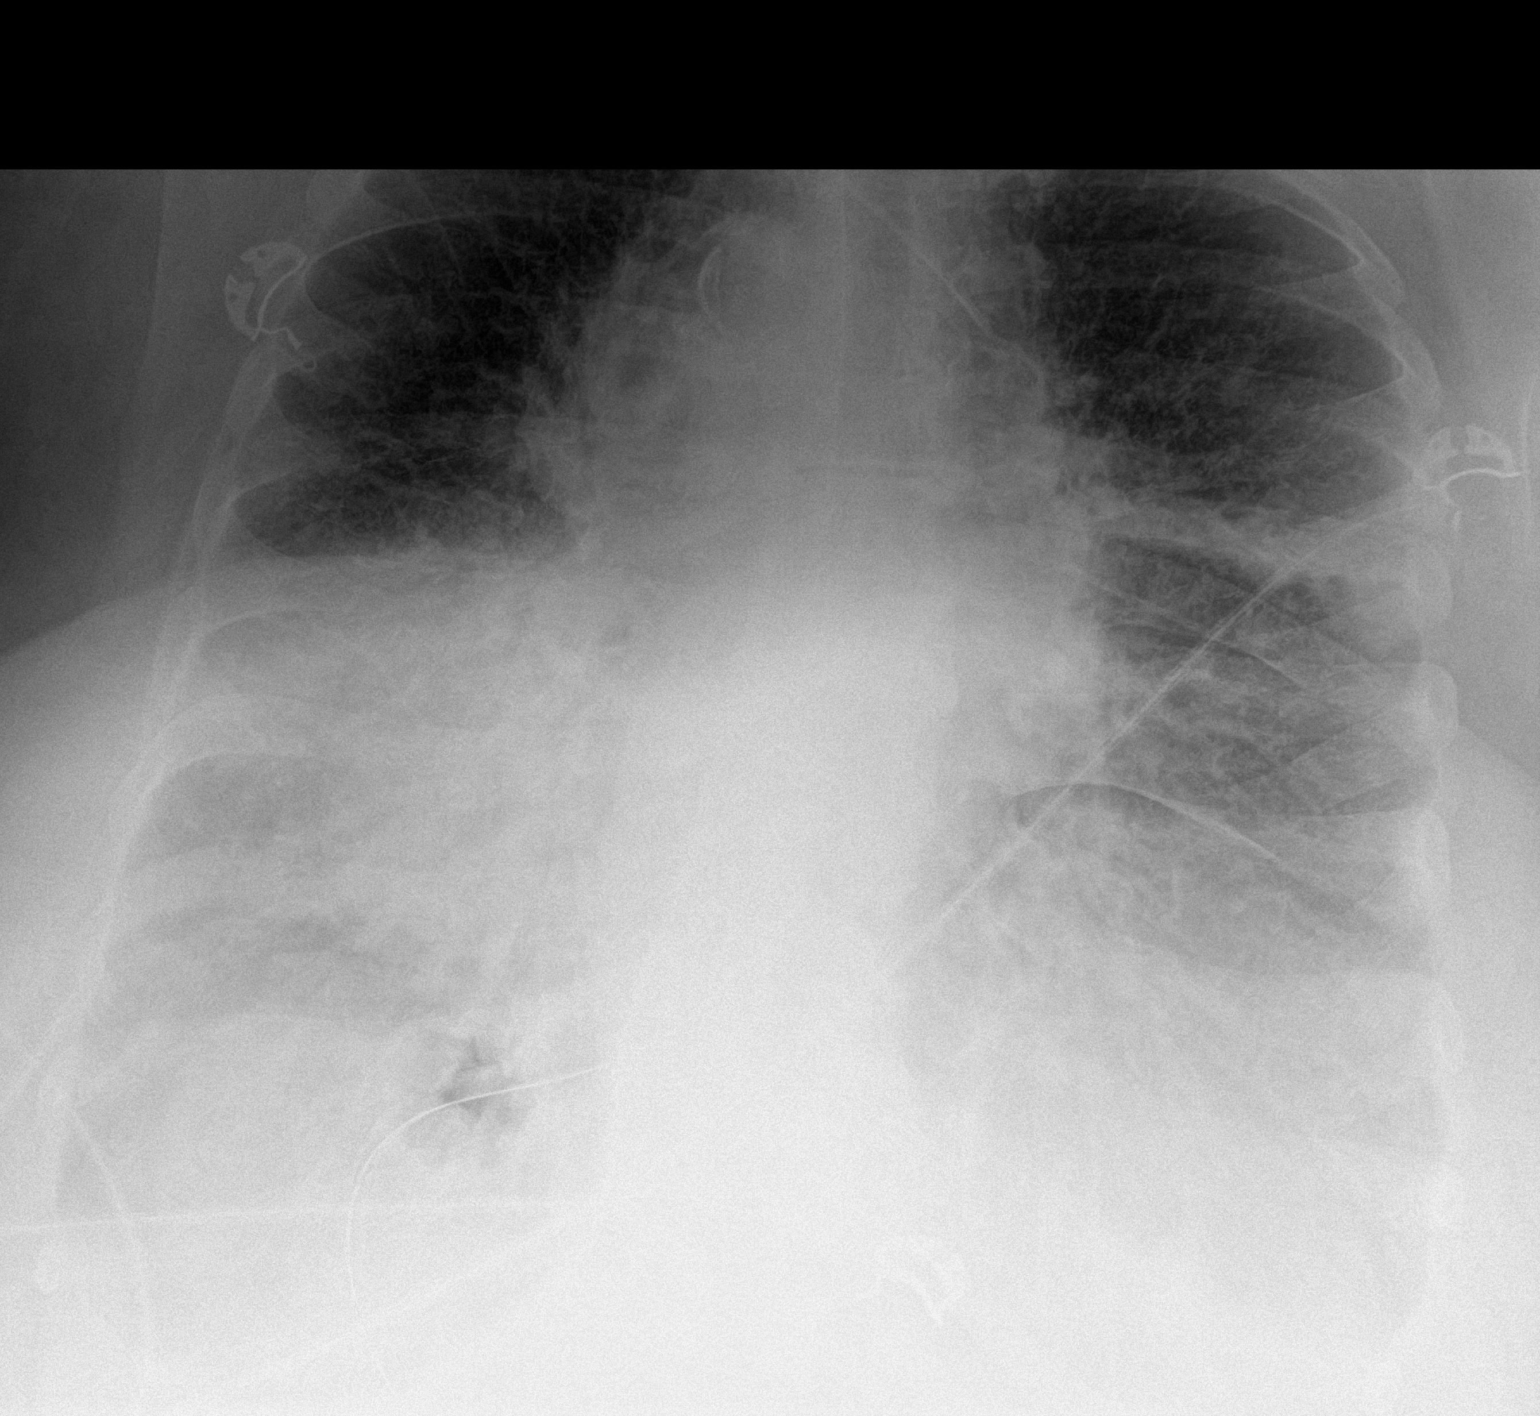

[1 of 1 positions shown; findings below may reference images not displayed]

FINDINGS: Prone positioning.

Endotracheal tube in good position. Left jugular catheter tip in the
SVC. NG in the stomach.

Cardiac enlargement. Diffuse bilateral airspace disease unchanged.
No pneumothorax or effusion.
IMPRESSION: Diffuse bilateral airspace disease unchanged. Support lines in good
position and unchanged.

## 2020-01-24 IMAGING — DX CHEST - 2 VIEW
2 series · 2 of 2 positions shown · non-contrast
Comparison: Multiple prior comparison most recent 02/28/2019

CLINICAL DATA: Positive test for COVID

EXAM:
CHEST - 2 VIEW

[chest pa]
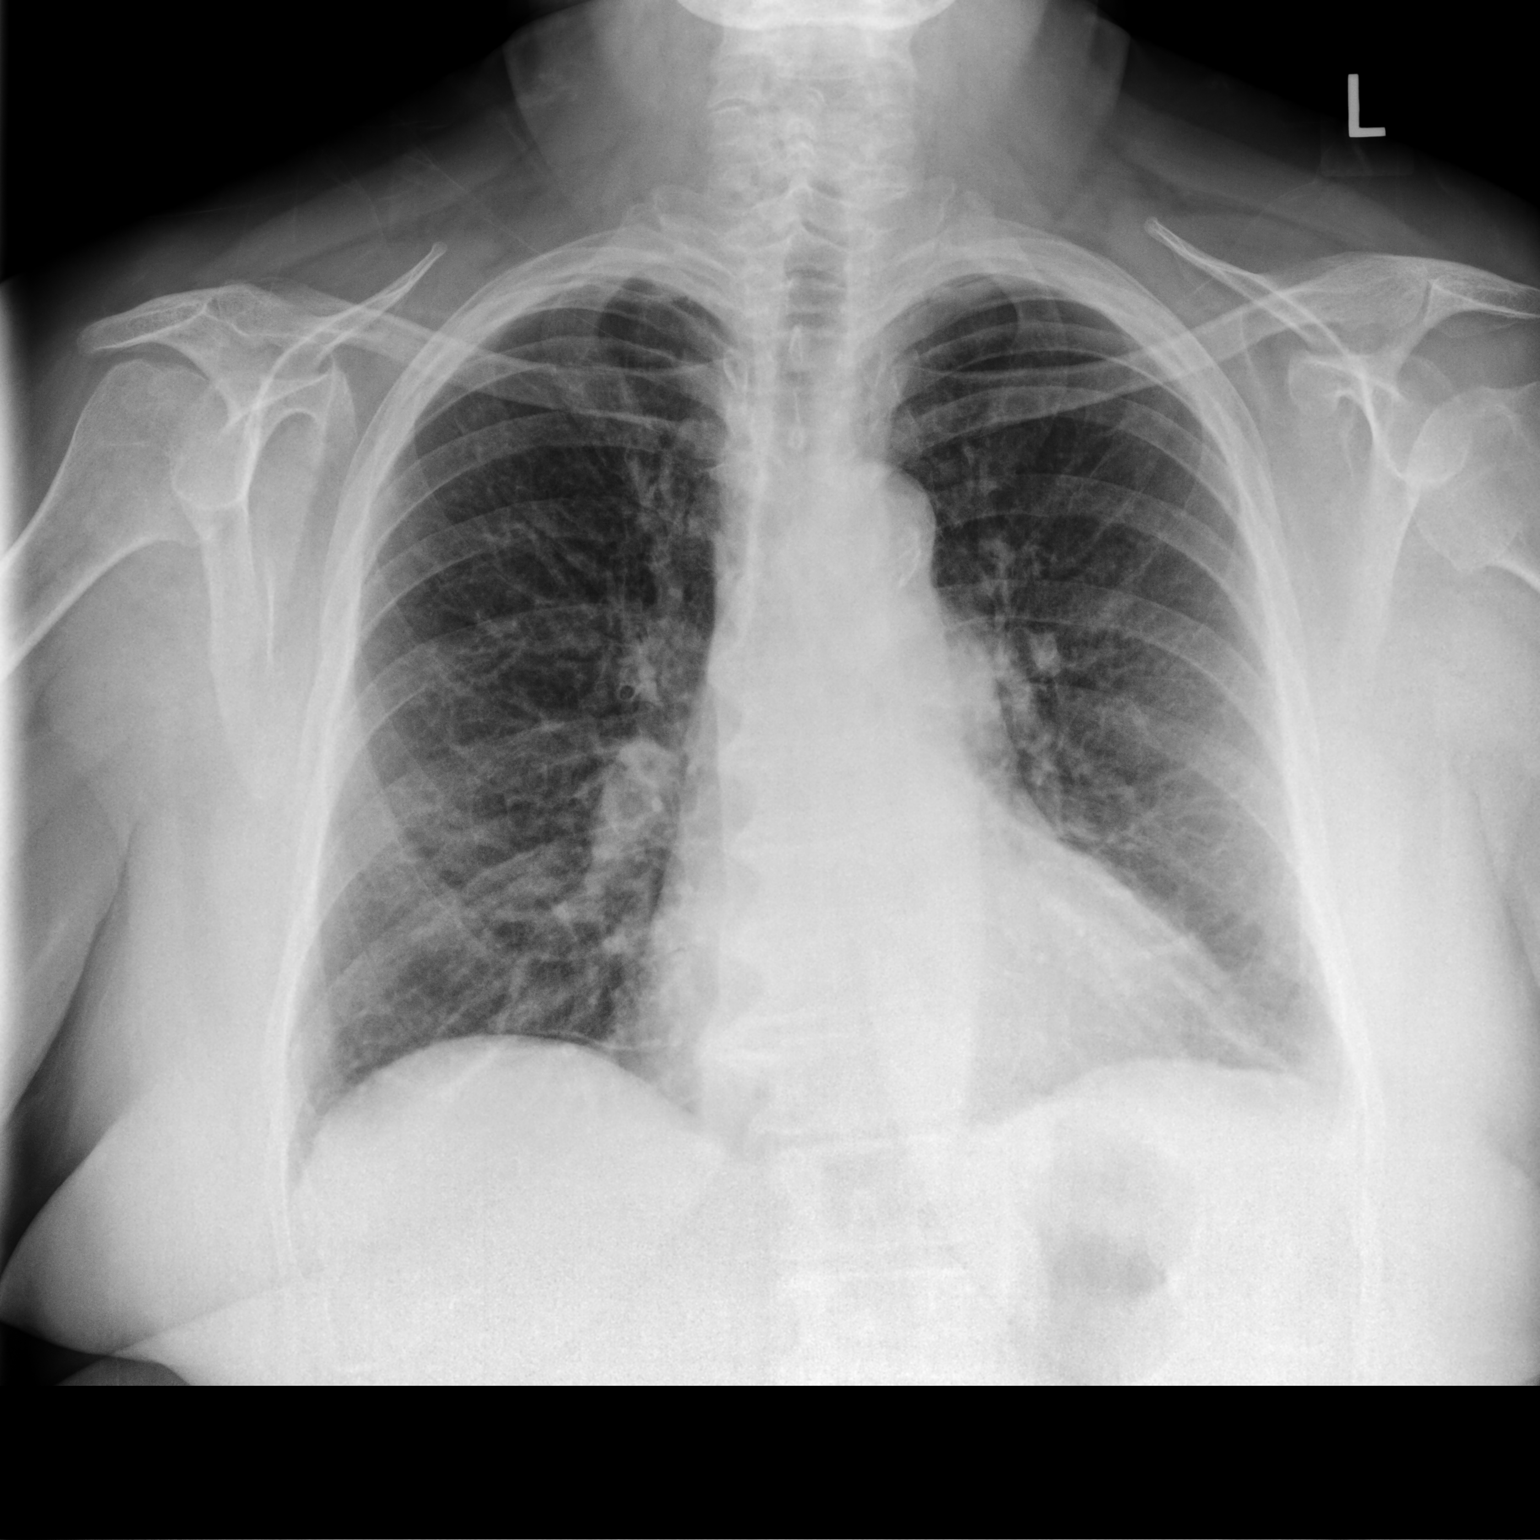

[chest lat]
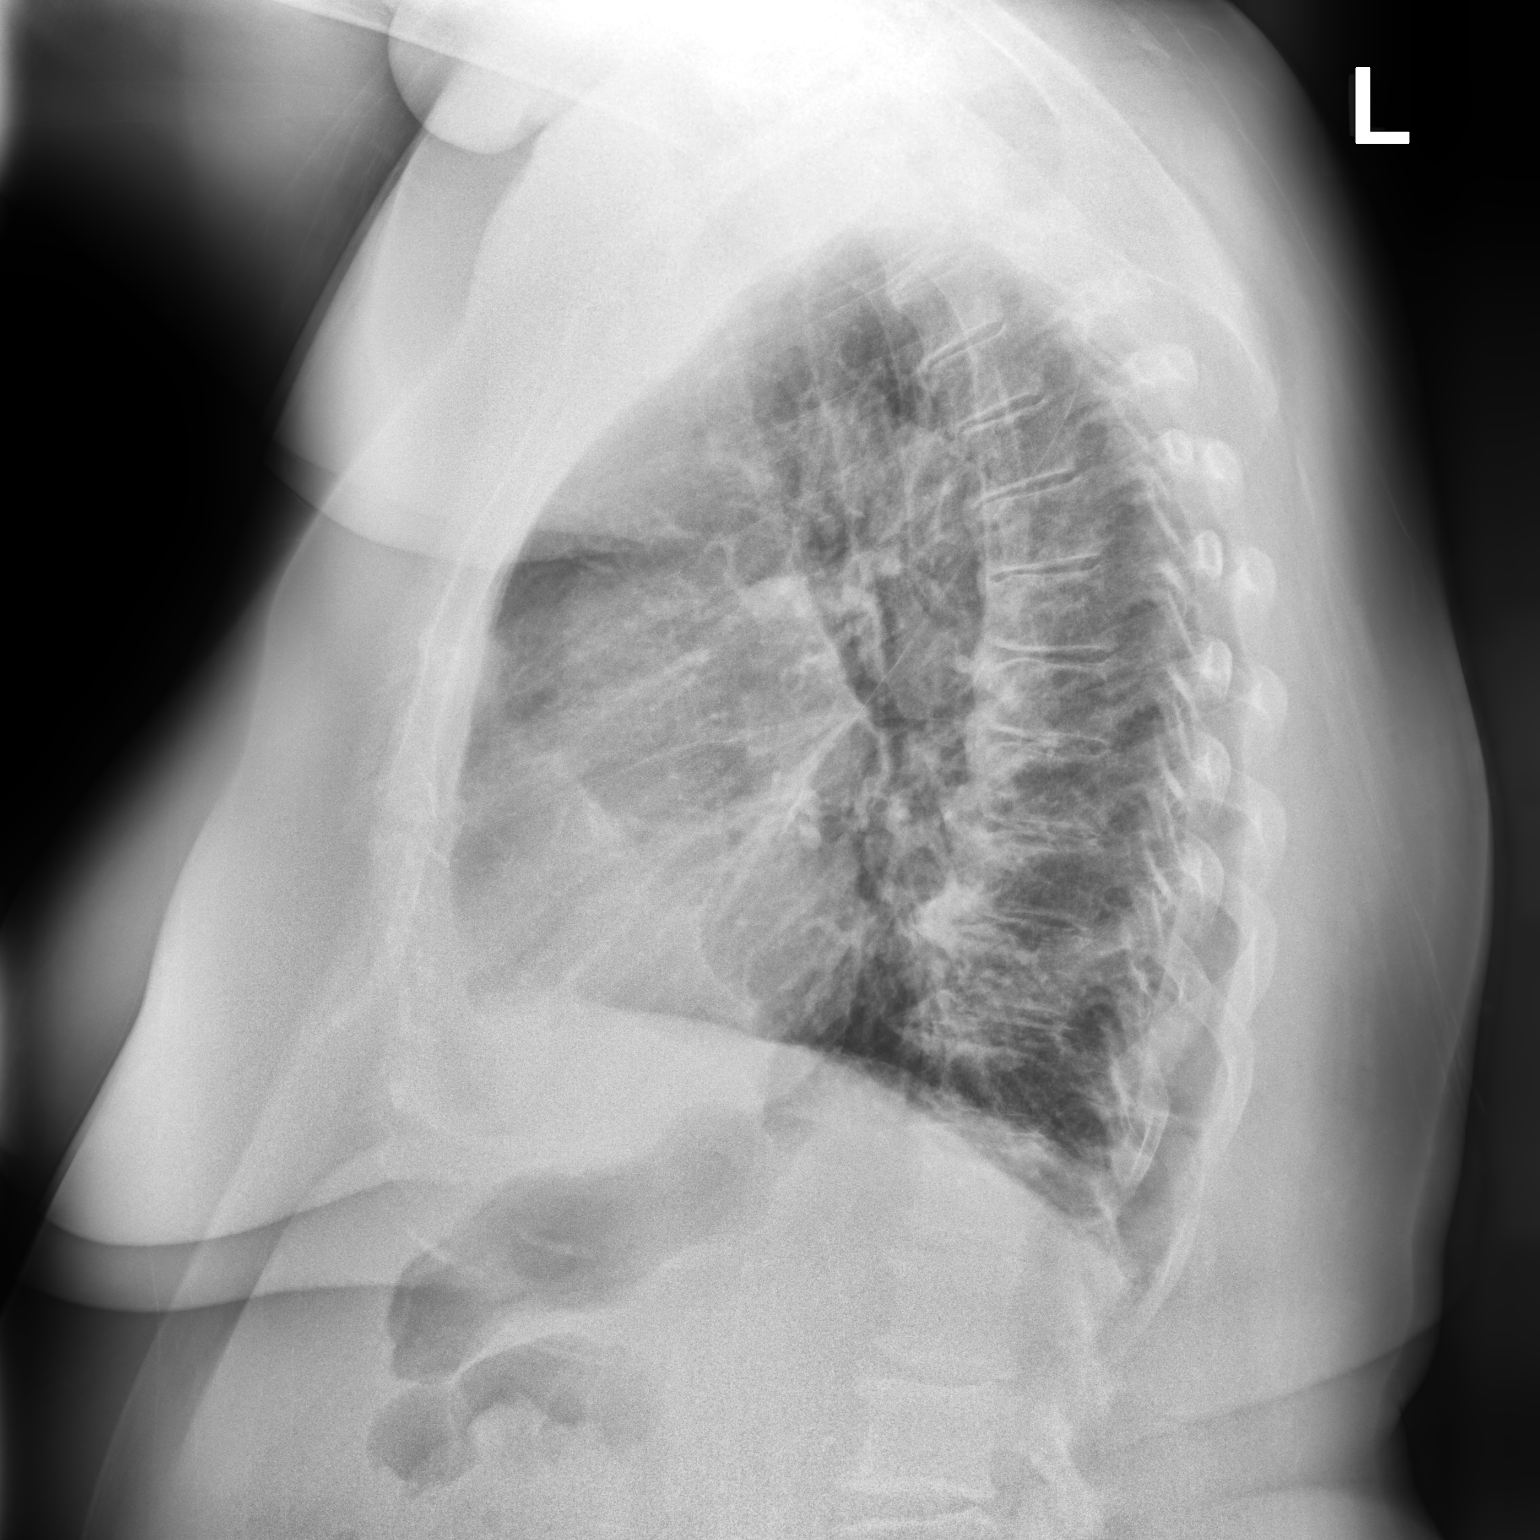

[2 of 2 positions shown; findings below may reference images not displayed]

FINDINGS: Cardiomediastinal silhouette unchanged in size and contour. No
evidence of interlobular septal thickening. No pneumothorax or
pleural effusion.

Mild reticular opacities of the bilateral lungs with no large
confluent airspace disease, pleural effusion, or pneumothorax.

No displaced fracture with degenerative changes of the spine.
IMPRESSION: Mild reticular opacities may reflect combination of chronic changes
and/or multifocal pneumonia given the history. No large confluent
airspace disease or evidence of lobar pneumonia.

## 2020-12-10 DEATH — deceased
